# Patient Record
Sex: Female | Born: 1940 | Race: White | Hispanic: No | Smoking: Never smoker
Health system: Southern US, Community
[De-identification: ages and names within clinical notes are randomized; demographics above are authoritative.]

## PROBLEM LIST (undated history)

## (undated) DIAGNOSIS — E039 Hypothyroidism, unspecified: Secondary | ICD-10-CM

## (undated) DIAGNOSIS — I10 Essential (primary) hypertension: Secondary | ICD-10-CM

## (undated) DIAGNOSIS — M4316 Spondylolisthesis, lumbar region: Secondary | ICD-10-CM

## (undated) DIAGNOSIS — Z972 Presence of dental prosthetic device (complete) (partial): Secondary | ICD-10-CM

## (undated) HISTORY — PX: MULTIPLE TOOTH EXTRACTIONS: SHX2053

## (undated) HISTORY — PX: CATARACT EXTRACTION W/ INTRAOCULAR LENS IMPLANT: SHX1309

## (undated) HISTORY — PX: COLONOSCOPY W/ BIOPSIES AND POLYPECTOMY: SHX1376

## (undated) HISTORY — PX: TONSILLECTOMY: SUR1361

## (undated) HISTORY — PX: BACK SURGERY: SHX140

---

## 1999-05-05 ENCOUNTER — Encounter: Payer: Self-pay | Admitting: Family Medicine

## 1999-05-05 ENCOUNTER — Encounter: Admission: RE | Admit: 1999-05-05 | Discharge: 1999-05-05 | Payer: Self-pay | Admitting: Family Medicine

## 1999-05-08 ENCOUNTER — Other Ambulatory Visit: Admission: RE | Admit: 1999-05-08 | Discharge: 1999-05-08 | Payer: Self-pay | Admitting: Family Medicine

## 2015-10-13 ENCOUNTER — Other Ambulatory Visit: Payer: Self-pay | Admitting: Family Medicine

## 2015-10-13 DIAGNOSIS — M5430 Sciatica, unspecified side: Secondary | ICD-10-CM

## 2015-10-14 ENCOUNTER — Ambulatory Visit
Admission: RE | Admit: 2015-10-14 | Discharge: 2015-10-14 | Disposition: A | Discharge status: Home / Self Care | Payer: Medicare Other | Source: Ambulatory Visit | Attending: Family Medicine | Admitting: Family Medicine

## 2015-10-14 DIAGNOSIS — M4806 Spinal stenosis, lumbar region: Secondary | ICD-10-CM | POA: Diagnosis not present

## 2015-10-14 DIAGNOSIS — M5430 Sciatica, unspecified side: Secondary | ICD-10-CM | POA: Insufficient documentation

## 2015-10-14 DIAGNOSIS — Z9889 Other specified postprocedural states: Secondary | ICD-10-CM | POA: Diagnosis not present

## 2015-10-14 DIAGNOSIS — M5126 Other intervertebral disc displacement, lumbar region: Secondary | ICD-10-CM | POA: Diagnosis not present

## 2015-10-14 LAB — POCT I-STAT CREATININE: Creatinine, Ser: 0.8 mg/dL (ref 0.44–1.00)

## 2015-10-14 MED ORDER — GADOBENATE DIMEGLUMINE 529 MG/ML IV SOLN
20.0000 mL | Freq: Once | INTRAVENOUS | Status: AC | PRN
  Administered 2015-10-14: 16 mL via INTRAVENOUS

## 2015-10-29 ENCOUNTER — Ambulatory Visit: Payer: Self-pay

## 2015-12-26 ENCOUNTER — Other Ambulatory Visit: Payer: Self-pay | Admitting: Neurosurgery

## 2016-03-05 ENCOUNTER — Encounter (HOSPITAL_COMMUNITY)
Admission: RE | Admit: 2016-03-05 | Discharge: 2016-03-05 | Disposition: A | Discharge status: Home / Self Care | Payer: Medicare Other | Source: Ambulatory Visit | Attending: Neurosurgery | Admitting: Neurosurgery

## 2016-03-05 ENCOUNTER — Other Ambulatory Visit: Payer: Self-pay

## 2016-03-05 ENCOUNTER — Encounter (HOSPITAL_COMMUNITY): Payer: Self-pay

## 2016-03-05 DIAGNOSIS — I10 Essential (primary) hypertension: Secondary | ICD-10-CM | POA: Diagnosis not present

## 2016-03-05 DIAGNOSIS — Z01818 Encounter for other preprocedural examination: Secondary | ICD-10-CM | POA: Insufficient documentation

## 2016-03-05 DIAGNOSIS — E039 Hypothyroidism, unspecified: Secondary | ICD-10-CM | POA: Insufficient documentation

## 2016-03-05 DIAGNOSIS — M47896 Other spondylosis, lumbar region: Secondary | ICD-10-CM | POA: Insufficient documentation

## 2016-03-05 DIAGNOSIS — Z01810 Encounter for preprocedural cardiovascular examination: Secondary | ICD-10-CM | POA: Insufficient documentation

## 2016-03-05 HISTORY — DX: Hypothyroidism, unspecified: E03.9

## 2016-03-05 HISTORY — DX: Presence of dental prosthetic device (complete) (partial): Z97.2

## 2016-03-05 HISTORY — DX: Spondylolisthesis, lumbar region: M43.16

## 2016-03-05 HISTORY — DX: Essential (primary) hypertension: I10

## 2016-03-05 LAB — BASIC METABOLIC PANEL
Anion gap: 4 — ABNORMAL LOW (ref 5–15)
BUN: 21 mg/dL — AB (ref 6–20)
CALCIUM: 9.6 mg/dL (ref 8.9–10.3)
CHLORIDE: 107 mmol/L (ref 101–111)
CO2: 27 mmol/L (ref 22–32)
Creatinine, Ser: 0.92 mg/dL (ref 0.44–1.00)
GFR, EST NON AFRICAN AMERICAN: 59 mL/min — AB (ref >60)
Glucose, Bld: 92 mg/dL (ref 65–99)
Potassium: 4.3 mmol/L (ref 3.5–5.1)
Sodium: 138 mmol/L (ref 135–145)

## 2016-03-05 LAB — CBC WITH DIFFERENTIAL/PLATELET
BASOS ABS: 0 10*3/uL (ref 0.0–0.1)
BASOS PCT: 0 %
EOS ABS: 0.3 10*3/uL (ref 0.0–0.7)
EOS PCT: 4 %
HCT: 46.1 % — ABNORMAL HIGH (ref 36.0–46.0)
HEMOGLOBIN: 15.4 g/dL — AB (ref 12.0–15.0)
LYMPHS ABS: 2.7 10*3/uL (ref 0.7–4.0)
Lymphocytes Relative: 34 %
MCH: 30.8 pg (ref 26.0–34.0)
MCHC: 33.4 g/dL (ref 30.0–36.0)
MCV: 92.2 fL (ref 78.0–100.0)
Monocytes Absolute: 0.6 10*3/uL (ref 0.1–1.0)
Monocytes Relative: 7 %
NEUTROS PCT: 55 %
Neutro Abs: 4.4 10*3/uL (ref 1.7–7.7)
PLATELETS: 251 10*3/uL (ref 150–400)
RBC: 5 MIL/uL (ref 3.87–5.11)
RDW: 13.9 % (ref 11.5–15.5)
WBC: 8.1 10*3/uL (ref 4.0–10.5)

## 2016-03-05 LAB — TYPE AND SCREEN
ABO/RH(D): O POS
ANTIBODY SCREEN: NEGATIVE

## 2016-03-05 LAB — ABO/RH: ABO/RH(D): O POS

## 2016-03-05 LAB — SURGICAL PCR SCREEN
MRSA, PCR: NEGATIVE
Staphylococcus aureus: POSITIVE — AB

## 2016-03-05 NOTE — Pre-Procedure Instructions (Signed)
    Dolores Pattyeggy A Brester  03/05/2016      [facility] [address]- [place], [place] - [address] [place] [id number] Phone: [phone] Fax: [phone]    Your procedure is scheduled on Monday, March 15, 2016  [facility] at 6:00 A.M.  Call this number if you have problems the morning of surgery:  [phone]   Remember:  Do not eat food or drink liquids after midnight Sunday, March 14, 2016  Take these medicines the morning of surgery with A SIP OF WATER : levothyroxine (SYNTHROID), if needed:         TraMADol (ULTRAM) for pain Stop taking Aspirin, vitamins, fish oil and herbal medications. Do not take any NSAIDs ie: Ibuprofen, Advil, Naproxen, BC and Goody Powder or any medication containing Aspirin; stop Monday, March 08, 2016  Do not wear jewelry, make-up or nail polish.  Do not wear lotions, powders, or perfumes, or deoderant.  Do not shave 48 hours prior to surgery.    Do not bring valuables to the hospital.  [facility] is not responsible for any belongings or valuables.  Contacts, dentures or bridgework may not be worn into surgery.  Leave your suitcase in the car.  After surgery it may be brought to your room.  For patients admitted to the hospital, discharge time will be determined by your treatment team.  Patients discharged the day of surgery will not be allowed to drive home.   Name and phone number of your driver:   Special instructions: Shower the night before surgery and the morning of surgery with CHG.  Please read over the following fact sheets that you were given. Pain Booklet, Coughing and Deep Breathing, Blood Transfusion Information, MRSA Information and Surgical Site Infection Prevention

## 2016-03-05 NOTE — Progress Notes (Signed)
Pt denies SOB, chest pain, and being under the care of a cardiologist. Pt denies having a stress test, echo and cardiac cath. Pt denies having an EKG and chest x ray within the last year. Pt denies having any recent labs. Pt chart forwarded to anesthesia to review EKG. 

## 2016-03-08 NOTE — Progress Notes (Addendum)
Anesthesia Chart Review:  Pt is a 75 year old female scheduled for L4-5 PLIF on 03/15/2016 with Julio SicksHenry Pool, MD.   PMH includes:  HTN, hypothyroidism. Never smoker. BMI 29  Medications include: benazepril, levothyroxine  Preoperative labs reviewed.    EKG 03/05/16: NSR. Possible Inferior infarct, age undetermined. No old tracing for comparison  Rica Mastngela Kabbe, FNP-BC [facility]/Anesthesiology Phone: [phone] 03/08/2016 4:39 PM

## 2016-03-14 NOTE — Anesthesia Preprocedure Evaluation (Signed)
Anesthesia Evaluation  Patient identified by MRN, date of birth, ID band Patient awake    Reviewed: Allergy & Precautions, H&P , Patient's Chart, lab work & pertinent test results, reviewed documented beta blocker date and time   History of Anesthesia Complications (+) history of anesthetic complications  Airway Mallampati: II  TM Distance: >3 FB Neck ROM: full    Dental no notable dental hx.    Pulmonary    Pulmonary exam normal breath sounds clear to auscultation       Cardiovascular hypertension,  Rhythm:regular Rate:Normal     Neuro/Psych    GI/Hepatic   Endo/Other    Renal/GU      Musculoskeletal   Abdominal   Peds  Hematology   Anesthesia Other Findings   Reproductive/Obstetrics                             Anesthesia Physical Anesthesia Plan  ASA: II  Anesthesia Plan: General   Post-op Pain Management:    Induction: Intravenous  Airway Management Planned: Oral ETT  Additional Equipment:   Intra-op Plan:   Post-operative Plan: Extubation in OR  Informed Consent: I have reviewed the patients History and Physical, chart, labs and discussed the procedure including the risks, benefits and alternatives for the proposed anesthesia with the patient or authorized representative who has indicated his/her understanding and acceptance.   Dental Advisory Given and Dental advisory given  Plan Discussed with: CRNA and Surgeon  Anesthesia Plan Comments: (  Discussed general anesthesia, including possible nausea, instrumentation of airway, sore throat,pulmonary aspiration, etc. I asked if the were any outstanding questions, or  concerns before we proceeded. )        Anesthesia Quick Evaluation

## 2016-03-15 ENCOUNTER — Inpatient Hospital Stay (HOSPITAL_COMMUNITY)
Admission: RE | Admit: 2016-03-15 | Discharge: 2016-03-17 | DRG: 460 | Disposition: A | Discharge status: Home / Self Care | Payer: Medicare Other | Source: Ambulatory Visit | Attending: Neurosurgery | Admitting: Neurosurgery

## 2016-03-15 ENCOUNTER — Inpatient Hospital Stay (HOSPITAL_COMMUNITY): Payer: Medicare Other | Admitting: Certified Registered Nurse Anesthetist

## 2016-03-15 ENCOUNTER — Encounter (HOSPITAL_COMMUNITY)
Admission: RE | Disposition: A | Discharge status: Home / Self Care | Payer: Self-pay | Source: Ambulatory Visit | Attending: Neurosurgery

## 2016-03-15 ENCOUNTER — Inpatient Hospital Stay (HOSPITAL_COMMUNITY): Payer: Medicare Other | Admitting: Emergency Medicine

## 2016-03-15 ENCOUNTER — Encounter (HOSPITAL_COMMUNITY): Payer: Self-pay | Admitting: *Deleted

## 2016-03-15 ENCOUNTER — Inpatient Hospital Stay (HOSPITAL_COMMUNITY): Payer: Medicare Other

## 2016-03-15 DIAGNOSIS — Z9841 Cataract extraction status, right eye: Secondary | ICD-10-CM | POA: Diagnosis not present

## 2016-03-15 DIAGNOSIS — M5116 Intervertebral disc disorders with radiculopathy, lumbar region: Principal | ICD-10-CM | POA: Diagnosis present

## 2016-03-15 DIAGNOSIS — Z809 Family history of malignant neoplasm, unspecified: Secondary | ICD-10-CM

## 2016-03-15 DIAGNOSIS — M4316 Spondylolisthesis, lumbar region: Secondary | ICD-10-CM | POA: Diagnosis present

## 2016-03-15 DIAGNOSIS — Z961 Presence of intraocular lens: Secondary | ICD-10-CM | POA: Diagnosis present

## 2016-03-15 DIAGNOSIS — E039 Hypothyroidism, unspecified: Secondary | ICD-10-CM | POA: Diagnosis present

## 2016-03-15 DIAGNOSIS — M549 Dorsalgia, unspecified: Secondary | ICD-10-CM | POA: Diagnosis present

## 2016-03-15 DIAGNOSIS — M4310 Spondylolisthesis, site unspecified: Secondary | ICD-10-CM | POA: Diagnosis present

## 2016-03-15 DIAGNOSIS — I10 Essential (primary) hypertension: Secondary | ICD-10-CM | POA: Diagnosis present

## 2016-03-15 DIAGNOSIS — M48061 Spinal stenosis, lumbar region without neurogenic claudication: Secondary | ICD-10-CM | POA: Diagnosis present

## 2016-03-15 DIAGNOSIS — Z419 Encounter for procedure for purposes other than remedying health state, unspecified: Secondary | ICD-10-CM

## 2016-03-15 DIAGNOSIS — Z9842 Cataract extraction status, left eye: Secondary | ICD-10-CM

## 2016-03-15 DIAGNOSIS — R262 Difficulty in walking, not elsewhere classified: Secondary | ICD-10-CM

## 2016-03-15 SURGERY — POSTERIOR LUMBAR FUSION 1 LEVEL
Anesthesia: General | Site: Back

## 2016-03-15 MED ORDER — FENTANYL CITRATE (PF) 100 MCG/2ML IJ SOLN
INTRAMUSCULAR | Status: AC
  Filled 2016-03-15: qty 2

## 2016-03-15 MED ORDER — CHLORHEXIDINE GLUCONATE CLOTH 2 % EX PADS: 6.0000 | MEDICATED_PAD | Freq: Once | CUTANEOUS | Status: DC

## 2016-03-15 MED ORDER — PROPOFOL 10 MG/ML IV BOLUS
INTRAVENOUS | Status: DC | PRN
  Administered 2016-03-15: 100 mg via INTRAVENOUS

## 2016-03-15 MED ORDER — HYDROCODONE-ACETAMINOPHEN 5-325 MG PO TABS: 1.0000 | ORAL_TABLET | ORAL | Status: DC | PRN

## 2016-03-15 MED ORDER — ACETAMINOPHEN 160 MG/5ML PO SOLN: 650.0000 mg | Freq: Once | ORAL | Status: DC

## 2016-03-15 MED ORDER — PHENYLEPHRINE HCL 10 MG/ML IJ SOLN
INTRAVENOUS | Status: DC | PRN
  Administered 2016-03-15: 20 ug/min via INTRAVENOUS

## 2016-03-15 MED ORDER — CEFAZOLIN IN D5W 1 GM/50ML IV SOLN
1.0000 g | Freq: Three times a day (TID) | INTRAVENOUS | Status: AC
  Administered 2016-03-15 – 2016-03-16 (×2): 1 g via INTRAVENOUS
  Filled 2016-03-15 (×2): qty 50

## 2016-03-15 MED ORDER — FENTANYL CITRATE (PF) 100 MCG/2ML IJ SOLN
INTRAMUSCULAR | Status: DC | PRN
  Administered 2016-03-15: 50 ug via INTRAVENOUS
  Administered 2016-03-15: 25 ug via INTRAVENOUS
  Administered 2016-03-15: 50 ug via INTRAVENOUS
  Administered 2016-03-15: 100 ug via INTRAVENOUS
  Administered 2016-03-15: 50 ug via INTRAVENOUS
  Administered 2016-03-15: 25 ug via INTRAVENOUS

## 2016-03-15 MED ORDER — BUPIVACAINE HCL (PF) 0.25 % IJ SOLN
INTRAMUSCULAR | Status: DC | PRN
  Administered 2016-03-15: 20 mL

## 2016-03-15 MED ORDER — ACETAMINOPHEN 650 MG RE SUPP: 650.0000 mg | RECTAL | Status: DC | PRN

## 2016-03-15 MED ORDER — OXYCODONE-ACETAMINOPHEN 5-325 MG PO TABS
1.0000 | ORAL_TABLET | ORAL | Status: DC | PRN
  Administered 2016-03-15: 1 via ORAL
  Administered 2016-03-16 – 2016-03-17 (×9): 2 via ORAL
  Filled 2016-03-15 (×10): qty 2

## 2016-03-15 MED ORDER — ROCURONIUM BROMIDE 100 MG/10ML IV SOLN
INTRAVENOUS | Status: DC | PRN
  Administered 2016-03-15: 60 mg via INTRAVENOUS
  Administered 2016-03-15 (×2): 10 mg via INTRAVENOUS

## 2016-03-15 MED ORDER — CEFAZOLIN SODIUM-DEXTROSE 2-4 GM/100ML-% IV SOLN
2.0000 g | INTRAVENOUS | Status: AC
  Administered 2016-03-15: 2 g via INTRAVENOUS
  Filled 2016-03-15: qty 100

## 2016-03-15 MED ORDER — PHENYLEPHRINE HCL 10 MG/ML IJ SOLN
INTRAMUSCULAR | Status: DC | PRN
  Administered 2016-03-15: 80 ug via INTRAVENOUS
  Administered 2016-03-15: 40 ug via INTRAVENOUS

## 2016-03-15 MED ORDER — DIAZEPAM 5 MG PO TABS
ORAL_TABLET | ORAL | Status: AC
  Filled 2016-03-15: qty 1

## 2016-03-15 MED ORDER — 0.9 % SODIUM CHLORIDE (POUR BTL) OPTIME
TOPICAL | Status: DC | PRN
  Administered 2016-03-15: 1000 mL

## 2016-03-15 MED ORDER — PROPOFOL 10 MG/ML IV BOLUS
INTRAVENOUS | Status: AC
  Filled 2016-03-15: qty 20

## 2016-03-15 MED ORDER — VANCOMYCIN HCL 1000 MG IV SOLR
INTRAVENOUS | Status: AC
  Filled 2016-03-15: qty 1000

## 2016-03-15 MED ORDER — SODIUM CHLORIDE 0.9 % IR SOLN
Status: DC | PRN
  Administered 2016-03-15: 08:00:00

## 2016-03-15 MED ORDER — PHENOL 1.4 % MT LIQD: 1.0000 | OROMUCOSAL | Status: DC | PRN

## 2016-03-15 MED ORDER — ACETAMINOPHEN 325 MG PO TABS
ORAL_TABLET | ORAL | Status: AC
  Filled 2016-03-15: qty 2

## 2016-03-15 MED ORDER — VANCOMYCIN HCL 1000 MG IV SOLR
INTRAVENOUS | Status: DC | PRN
Start: 2016-03-15 — End: 2016-03-15
  Administered 2016-03-15: 1000 mg

## 2016-03-15 MED ORDER — SODIUM CHLORIDE 0.9% FLUSH: 3.0000 mL | Freq: Two times a day (BID) | INTRAVENOUS | Status: DC

## 2016-03-15 MED ORDER — ONDANSETRON HCL 4 MG/2ML IJ SOLN: 4.0000 mg | INTRAMUSCULAR | Status: DC | PRN

## 2016-03-15 MED ORDER — DIAZEPAM 5 MG PO TABS
5.0000 mg | ORAL_TABLET | Freq: Four times a day (QID) | ORAL | Status: DC | PRN
  Administered 2016-03-15 – 2016-03-17 (×7): 5 mg via ORAL
  Filled 2016-03-15 (×2): qty 1
  Filled 2016-03-15: qty 2
  Filled 2016-03-15 (×4): qty 1

## 2016-03-15 MED ORDER — FENTANYL CITRATE (PF) 100 MCG/2ML IJ SOLN
INTRAMUSCULAR | Status: AC
  Administered 2016-03-15: 25 ug via INTRAVENOUS
  Filled 2016-03-15: qty 2

## 2016-03-15 MED ORDER — HYDROMORPHONE HCL 1 MG/ML IJ SOLN
0.5000 mg | INTRAMUSCULAR | Status: DC | PRN
  Administered 2016-03-15: 1 mg via INTRAVENOUS
  Filled 2016-03-15: qty 1

## 2016-03-15 MED ORDER — LACTATED RINGERS IV SOLN
INTRAVENOUS | Status: DC
  Administered 2016-03-15 (×3): via INTRAVENOUS

## 2016-03-15 MED ORDER — EPHEDRINE SULFATE 50 MG/ML IJ SOLN
INTRAMUSCULAR | Status: DC | PRN
  Administered 2016-03-15: 10 mg via INTRAVENOUS
  Administered 2016-03-15: 15 mg via INTRAVENOUS

## 2016-03-15 MED ORDER — ACETAMINOPHEN 325 MG PO TABS: 650.0000 mg | ORAL_TABLET | ORAL | Status: DC | PRN

## 2016-03-15 MED ORDER — BENAZEPRIL HCL 40 MG PO TABS
40.0000 mg | ORAL_TABLET | Freq: Every day | ORAL | Status: DC
  Filled 2016-03-15 (×3): qty 1

## 2016-03-15 MED ORDER — SODIUM CHLORIDE 0.9 % IV SOLN
INTRAVENOUS | Status: DC | PRN
  Administered 2016-03-15: 11:00:00 via INTRAVENOUS

## 2016-03-15 MED ORDER — LEVOTHYROXINE SODIUM 100 MCG PO TABS
150.0000 ug | ORAL_TABLET | Freq: Every day | ORAL | Status: DC
  Administered 2016-03-16 – 2016-03-17 (×2): 150 ug via ORAL
  Filled 2016-03-15 (×2): qty 2

## 2016-03-15 MED ORDER — FENTANYL CITRATE (PF) 100 MCG/2ML IJ SOLN
25.0000 ug | INTRAMUSCULAR | Status: DC | PRN
  Administered 2016-03-15: 25 ug via INTRAVENOUS
  Administered 2016-03-15: 50 ug via INTRAVENOUS
  Administered 2016-03-15: 25 ug via INTRAVENOUS

## 2016-03-15 MED ORDER — SUGAMMADEX SODIUM 200 MG/2ML IV SOLN
INTRAVENOUS | Status: DC | PRN
Start: 2016-03-15 — End: 2016-03-15
  Administered 2016-03-15: 200 mg via INTRAVENOUS

## 2016-03-15 MED ORDER — DEXAMETHASONE SODIUM PHOSPHATE 10 MG/ML IJ SOLN
10.0000 mg | INTRAMUSCULAR | Status: DC
  Filled 2016-03-15: qty 1

## 2016-03-15 MED ORDER — SODIUM CHLORIDE 0.9% FLUSH: 3.0000 mL | INTRAVENOUS | Status: DC | PRN

## 2016-03-15 MED ORDER — DEXAMETHASONE SODIUM PHOSPHATE 4 MG/ML IJ SOLN
INTRAMUSCULAR | Status: DC | PRN
  Administered 2016-03-15: 10 mg via INTRAVENOUS

## 2016-03-15 MED ORDER — ACETAMINOPHEN 325 MG PO TABS
650.0000 mg | ORAL_TABLET | Freq: Four times a day (QID) | ORAL | Status: DC | PRN
  Administered 2016-03-15: 650 mg via ORAL

## 2016-03-15 MED ORDER — FENTANYL CITRATE (PF) 100 MCG/2ML IJ SOLN
INTRAMUSCULAR | Status: AC
  Filled 2016-03-15: qty 4

## 2016-03-15 MED ORDER — MENTHOL 3 MG MT LOZG: 1.0000 | LOZENGE | OROMUCOSAL | Status: DC | PRN

## 2016-03-15 MED ORDER — FENTANYL CITRATE (PF) 100 MCG/2ML IJ SOLN
50.0000 ug | INTRAMUSCULAR | Status: DC | PRN
  Administered 2016-03-15 (×2): 50 ug via INTRAVENOUS

## 2016-03-15 MED ORDER — TRAMADOL HCL 50 MG PO TABS: 50.0000 mg | ORAL_TABLET | Freq: Four times a day (QID) | ORAL | Status: DC | PRN

## 2016-03-15 MED ORDER — THROMBIN 20000 UNITS EX SOLR
CUTANEOUS | Status: DC | PRN
  Administered 2016-03-15: 08:00:00 via TOPICAL

## 2016-03-15 MED ORDER — THROMBIN 20000 UNITS EX SOLR
CUTANEOUS | Status: AC
  Filled 2016-03-15: qty 20000

## 2016-03-15 MED ORDER — ARTIFICIAL TEARS OP OINT
TOPICAL_OINTMENT | OPHTHALMIC | Status: DC | PRN
  Administered 2016-03-15: 1 via OPHTHALMIC

## 2016-03-15 MED ORDER — LIDOCAINE HCL (CARDIAC) 20 MG/ML IV SOLN
INTRAVENOUS | Status: DC | PRN
  Administered 2016-03-15: 100 mg via INTRAVENOUS

## 2016-03-15 MED ORDER — ONDANSETRON HCL 4 MG/2ML IJ SOLN
INTRAMUSCULAR | Status: DC | PRN
  Administered 2016-03-15: 4 mg via INTRAVENOUS

## 2016-03-15 SURGICAL SUPPLY — 64 items
ADH SKN CLS APL DERMABOND .7 (GAUZE/BANDAGES/DRESSINGS) ×1
APL SKNCLS STERI-STRIP NONHPOA (GAUZE/BANDAGES/DRESSINGS) ×1
BAG DECANTER FOR FLEXI CONT (MISCELLANEOUS) ×3 IMPLANT
BENZOIN TINCTURE PRP APPL 2/3 (GAUZE/BANDAGES/DRESSINGS) ×3 IMPLANT
BLADE CLIPPER SURG (BLADE) IMPLANT
BUR CUTTER 7.0 ROUND (BURR) IMPLANT
BUR MATCHSTICK NEURO 3.0 LAGG (BURR) ×3 IMPLANT
CANISTER SUCT 3000ML PPV (MISCELLANEOUS) ×3 IMPLANT
CAP LCK SPNE (Orthopedic Implant) ×6 IMPLANT
CAP LOCK SPINE RADIUS (Orthopedic Implant) ×6 IMPLANT
CAP LOCKING (Orthopedic Implant) ×12 IMPLANT
CLOSURE WOUND 1/2 X4 (GAUZE/BANDAGES/DRESSINGS) ×2
CONT SPEC 4OZ CLIKSEAL STRL BL (MISCELLANEOUS) ×3 IMPLANT
COVER BACK TABLE 60X90IN (DRAPES) ×3 IMPLANT
CROSSLINK MEDIUM (Orthopedic Implant) ×3 IMPLANT
DECANTER SPIKE VIAL GLASS SM (MISCELLANEOUS) IMPLANT
DERMABOND ADVANCED (GAUZE/BANDAGES/DRESSINGS) ×2
DERMABOND ADVANCED .7 DNX12 (GAUZE/BANDAGES/DRESSINGS) ×1 IMPLANT
DEVICE INTERBODY ELEVATE 23X7 (Cage) ×6 IMPLANT
DEVICE INTERBODY ELEVATE 23X8 (Cage) ×6 IMPLANT
DRAPE C-ARM 42X72 X-RAY (DRAPES) ×6 IMPLANT
DRAPE HALF SHEET 40X57 (DRAPES) IMPLANT
DRAPE LAPAROTOMY 100X72X124 (DRAPES) ×3 IMPLANT
DRAPE POUCH INSTRU U-SHP 10X18 (DRAPES) ×3 IMPLANT
DRAPE SURG 17X23 STRL (DRAPES) ×12 IMPLANT
DRSG OPSITE POSTOP 4X8 (GAUZE/BANDAGES/DRESSINGS) ×3 IMPLANT
DURAPREP 26ML APPLICATOR (WOUND CARE) ×3 IMPLANT
ELECT REM PT RETURN 9FT ADLT (ELECTROSURGICAL) ×3
ELECTRODE REM PT RTRN 9FT ADLT (ELECTROSURGICAL) ×1 IMPLANT
EVACUATOR 1/8 PVC DRAIN (DRAIN) IMPLANT
GAUZE SPONGE 4X4 12PLY STRL (GAUZE/BANDAGES/DRESSINGS) ×3 IMPLANT
GAUZE SPONGE 4X4 16PLY XRAY LF (GAUZE/BANDAGES/DRESSINGS) IMPLANT
GLOVE ECLIPSE 6.5 STRL STRAW (GLOVE) ×3 IMPLANT
GLOVE ECLIPSE 9.0 STRL (GLOVE) ×9 IMPLANT
GLOVE EXAM NITRILE LRG STRL (GLOVE) IMPLANT
GLOVE EXAM NITRILE XL STR (GLOVE) IMPLANT
GLOVE EXAM NITRILE XS STR PU (GLOVE) IMPLANT
GOWN STRL REUS W/ TWL LRG LVL3 (GOWN DISPOSABLE) ×2 IMPLANT
GOWN STRL REUS W/ TWL XL LVL3 (GOWN DISPOSABLE) ×2 IMPLANT
GOWN STRL REUS W/TWL 2XL LVL3 (GOWN DISPOSABLE) IMPLANT
GOWN STRL REUS W/TWL LRG LVL3 (GOWN DISPOSABLE) ×6
GOWN STRL REUS W/TWL XL LVL3 (GOWN DISPOSABLE) ×6
KIT BASIN OR (CUSTOM PROCEDURE TRAY) ×3 IMPLANT
KIT ROOM TURNOVER OR (KITS) ×3 IMPLANT
NEEDLE HYPO 22GX1.5 SAFETY (NEEDLE) ×3 IMPLANT
NS IRRIG 1000ML POUR BTL (IV SOLUTION) ×3 IMPLANT
PACK LAMINECTOMY NEURO (CUSTOM PROCEDURE TRAY) ×3 IMPLANT
ROD 5.5X60MM GREEN (Rod) ×3 IMPLANT
ROD 70MM (Rod) ×2 IMPLANT
ROD SPNL 70X5.5XNS TI RDS (Rod) ×1 IMPLANT
SCREW 5.75X40M (Screw) ×6 IMPLANT
SCREW 5.75X45MM (Screw) ×12 IMPLANT
SPACER SPNL STD 23X8XSTRL (Cage) ×2 IMPLANT
SPCR SPNL STD 23X8XSTRL (Cage) ×2 IMPLANT
SPONGE SURGIFOAM ABS GEL 100 (HEMOSTASIS) ×6 IMPLANT
STRIP CLOSURE SKIN 1/2X4 (GAUZE/BANDAGES/DRESSINGS) ×4 IMPLANT
SUT VIC AB 0 CT1 18XCR BRD8 (SUTURE) ×2 IMPLANT
SUT VIC AB 0 CT1 8-18 (SUTURE) ×4
SUT VIC AB 2-0 CT1 18 (SUTURE) ×3 IMPLANT
SUT VIC AB 3-0 SH 8-18 (SUTURE) ×6 IMPLANT
TOWEL OR 17X24 6PK STRL BLUE (TOWEL DISPOSABLE) ×3 IMPLANT
TOWEL OR 17X26 10 PK STRL BLUE (TOWEL DISPOSABLE) ×3 IMPLANT
TRAY FOLEY W/METER SILVER 16FR (SET/KITS/TRAYS/PACK) ×3 IMPLANT
WATER STERILE IRR 1000ML POUR (IV SOLUTION) ×3 IMPLANT

## 2016-03-15 NOTE — Anesthesia Postprocedure Evaluation (Signed)
Anesthesia Post Note  Patient: Heather Cantrell  Procedure(s) Performed: Procedure(s) (LRB): Posterior Lumbar interbody -Fusion Lumbar four-five and lumbar three-four (N/A)  Patient location during evaluation: PACU Anesthesia Type: General Level of consciousness: sedated Pain management: satisfactory to patient Vital Signs Assessment: post-procedure vital signs reviewed and stable Respiratory status: spontaneous breathing Cardiovascular status: stable Anesthetic complications: no    Last Vitals:  Vitals:   03/15/16 1535 03/15/16 1616  BP: (!) 153/76 (!) 162/67  Pulse: 66 69  Resp: 14 16  Temp: 36.7 C 36.4 C    Last Pain:  Vitals:   03/15/16 1535  TempSrc:   PainSc: 3                  Sharetha Newson EDWARD

## 2016-03-15 NOTE — Anesthesia Procedure Notes (Signed)
Procedure Name: Intubation Date/Time: 03/15/2016 9:04 AM Performed by: Tillman AbideHAWKINS, Daesia Zylka B Pre-anesthesia Checklist: Patient identified, Emergency Drugs available, Suction available and Patient being monitored Patient Re-evaluated:Patient Re-evaluated prior to inductionOxygen Delivery Method: Circle System Utilized Preoxygenation: Pre-oxygenation with 100% oxygen Intubation Type: IV induction Ventilation: Mask ventilation without difficulty Laryngoscope Size: Miller Grade View: Grade I Tube type: Oral Tube size: 7.5 mm Number of attempts: 1 Airway Equipment and Method: Stylet Placement Confirmation: ETT inserted through vocal cords under direct vision,  positive ETCO2 and breath sounds checked- equal and bilateral Secured at: 22 cm Tube secured with: Tape Dental Injury: Teeth and Oropharynx as per pre-operative assessment  Difficulty Due To: Difficulty was unanticipated

## 2016-03-15 NOTE — Op Note (Signed)
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  Postoperative diagnosis: Same  Procedure Name: L3-4, L4-5 reexploration of laminotomy with bilateral L3, L4, L5 foraminotomies (more than would be required for simple interbody fusion alone)  L3-4, L4-5 posterior lumbar interbody fusion utilizing interbody expandable cages and locally harvested autograft.  L3-4-5 posterior lateral arthrodesis utilizing segmental pedicle screw fixation and local autograft.  Surgeon:Kaan Tosh A.Weslyn Holsonback, M.D.  Asst. Surgeon: Franky Machoabbell  Anesthesia: General  Indication: 75 year old female status post multilevel right-sided decompressive hemilaminectomies in the past who presents now with worsening back and right lower extremity pain. Workup demonstrates evidence of progressive degenerative/post laminectomy spondylolisthesis at L4-5 with an associated disc herniation and marked foraminal stenosis causing severe compression of the right L4 nerve root and moderate compression of the right L5 nerve root. Patient also with significant disc degeneration at L3-4.  Operative note: After induction of anesthesia, patient positioned prone on the Wilson frame and appropriate padded. Lumbar region prepped and draped sterilely. Incision made overlying L3-4-5. Dissection performed bilaterally. Retractor placed. FluoroScan use. Level was confirmed. Unrecognized fracture of her inferior facet of L3 on the right was discovered.. Exploring this interspace determined there was gross instability at this level in addition to the spondylolisthesis at L4-5, I made the decision to proceed with L3-4 and L4-5 fusion based on this fracture and instability at L3-4  . I I dissected free the previous hemilaminotomies at L3-4  and L4-5 on the right side. I completed complete laminectomies of L L3-L4 and the superior aspect of L5 bilaterally. I resected ligamentum flavum and epidural scar. Bilateral decompressive foraminotomies were performed on course exiting L3-L4 and L5 nerve roots. Superior disc herniation is an associated osteophyte was removed at L4-5. Bilateral discectomy was then performed at L3-4 and L4-5. Disc spaces were then prepared for interbody fusion. Disc spaces were cleaned with various curettes and reamers. With distractors left patient's right side. An 8 mm standard Medtronic expandable cage was impacted into place at L3-4 and a 7 mm lordotic cage was impacted in place at L4-5. Each cage was expanded to its full extent. Distractors removed and patient's contralateral side. Disc space was prepared on the contralateral side bone graft was packed into the interspace and a second cage was then impacted in place and expanded to its full extent. Pedicle screws are placed bilaterally at L3-4 and 5 bilaterally utilizing fluoroscopic guidance. 5.75 mm full-radius Grant screws were placed bilaterally. Posterior lateral bone was packed using local autograft. Short segment titanium rods placed of the screw heads. Locking caps with her the screws. Locking caps engaged under mild compression. Gelfoam was placed topically every thecal sac. Baseline question Jarold Mottoatterson place a deep point 2 medium Hemovac drains left deep 1 . Wounds and closed in layers with Vicryl sutures. Steri-Strips triggers were applied. No apparent complications. Patient tolerated the procedure well and she returns to the recover room postop.

## 2016-03-15 NOTE — Transfer of Care (Signed)
Immediate Anesthesia Transfer of Care Note  Patient: Heather Cantrell  Procedure(s) Performed: Procedure(s): Posterior Lumbar interbody -Fusion Lumbar four-five and lumbar three-four (N/A)  Patient Location: PACU  Anesthesia Type:General  Level of Consciousness: awake, alert , oriented and patient cooperative  Airway & Oxygen Therapy: Patient Spontanous Breathing and Patient connected to nasal cannula oxygen  Post-op Assessment: Report given to RN and Post -op Vital signs reviewed and stable  Post vital signs: Reviewed and stable  Last Vitals:  Vitals:   03/15/16 0740  BP: 140/84  Pulse: 67  Resp: 20  Temp: 36.7 C    Last Pain:  Vitals:   03/15/16 0740  TempSrc: Oral  PainSc: 3       Patients Stated Pain Goal: 4 (03/15/16 0740)  Complications: No apparent anesthesia complications

## 2016-03-15 NOTE — H&P (Signed)
  Heather Cantrell is an 75 y.o. female.   Chief Complaint: Back and right leg pain HPI: 75 year old female with severe back and bilateral lower extremity pain right greater than left. Workup demonstrates evidence of a degenerative spondylolisthesis with recurrent lumbar disc herniation at L4-5 causing significant stenosis. Patient is failed conservative management and presents now for decompression and fusion at L4-5 in hopes of improving her symptoms.  Past Medical History:  Diagnosis Date  . Hypertension   . Hypothyroidism   . Spondylolisthesis at L4-L5 level   . Wears dentures    full set    Past Surgical History:  Procedure Laterality Date  . BACK SURGERY    . CATARACT EXTRACTION W/ INTRAOCULAR LENS IMPLANT     B/L  . COLONOSCOPY W/ BIOPSIES AND POLYPECTOMY    . MULTIPLE TOOTH EXTRACTIONS    . TONSILLECTOMY      Family History  Problem Relation Age of Onset  . Cancer Mother   . Cancer Father    Social History:  reports that she has never smoked. She has never used smokeless tobacco. She reports that she drinks alcohol. She reports that she does not use drugs.  Allergies:  Allergies  Allergen Reactions  . No Allergies On File     Medications Prior to Admission  Medication Sig Dispense Refill  . benazepril (LOTENSIN) 40 MG tablet Take 40 mg by mouth daily.    Marland Kitchen. levothyroxine (SYNTHROID, LEVOTHROID) 150 MCG tablet Take 150 mcg by mouth daily before breakfast.    . traMADol (ULTRAM) 50 MG tablet Take 50 mg by mouth every 6 (six) hours as needed for moderate pain.      No results found for this or any previous visit (from the past 48 hour(s)). No results found.  Pertinent items noted in HPI and remainder of comprehensive ROS otherwise negative.  Blood pressure 140/84, pulse 67, temperature 98 F (36.7 C), temperature source Oral, resp. rate 20, height 5\' 4"  (1.626 m), weight 77.1 kg (170 lb), SpO2 100 %.  Patient is awake and alert. She is oriented and appropriate.  Cranial nerve function is intact. Speech is fluent. Judgment and insight are intact. Neck is supple. Examination her extremities reveals no evidence of injury or deformity. Motor strength reveals 5 over 5 strength in both upper and lower extremities. Sensory examination was decreased sensation. Light touch in her right L4 and L5 dermatomes. Wound is well-healed. Chest and abdomen benign. Assessment/Plan L4-5 degenerative/post laminectomy spondylolisthesis with stenosis and radiculopathy. Plan reexploration of L4-5 laminotomy with bilateral L4-5 decompressive laminotomies and foraminotomies followed by posterior lumbar interbody fusion utilizing interbody expandable cages, locally harvested autograft, and augmented with posterior lateral arthrodesis utilizing nonsegmental pedicle screw fixation and local autograft. Risks and benefits been explained. Patient wishes to proceed.  Draxton Luu A 03/15/2016, 8:51 AM

## 2016-03-16 LAB — CBC
HCT: 33.8 % — ABNORMAL LOW (ref 36.0–46.0)
HEMOGLOBIN: 11 g/dL — AB (ref 12.0–15.0)
MCH: 29.6 pg (ref 26.0–34.0)
MCHC: 32.5 g/dL (ref 30.0–36.0)
MCV: 91.1 fL (ref 78.0–100.0)
PLATELETS: 249 10*3/uL (ref 150–400)
RBC: 3.71 MIL/uL — ABNORMAL LOW (ref 3.87–5.11)
RDW: 14.1 % (ref 11.5–15.5)
WBC: 10.9 10*3/uL — ABNORMAL HIGH (ref 4.0–10.5)

## 2016-03-16 MED FILL — Sodium Chloride IV Soln 0.9%: INTRAVENOUS | Qty: 2000 | Status: AC

## 2016-03-16 MED FILL — Heparin Sodium (Porcine) Inj 1000 Unit/ML: INTRAMUSCULAR | Qty: 30 | Status: AC

## 2016-03-16 NOTE — Progress Notes (Signed)
Postop day 1. Afebrile. Vitals are stable. Awake and alert. Oriented and appropriate. Motor and sensory function intact. Preoperative pain much improved. Ambulating well. Possible discharge tomorrow.

## 2016-03-16 NOTE — Evaluation (Signed)
Occupational Therapy Evaluation Patient Details Name: Heather Cantrell MRN: [id number] DOB: 1940-11-20 Today's Date: 03/16/2016    History of Present Illness Pt is a 75 y/o female s/p L3-5 foraminotomies and PLIF. PMHx: hypertension, hypothyrodism, back sx   Clinical Impression   PTA Pt independent in ADL and IADL, lives alone with recent move to [place] from [place], [place]. OT reviewed back precautions with Pt and daughter, educated on AE. Pt with deficits below. Pt will continue to benefit from skilled OT intervention while in the acute setting to practice AE use and maintaining precautions during ADL.      Follow Up Recommendations  No OT follow up;Supervision - Intermittent    Equipment Recommendations  3 in 1 bedside comode    Recommendations for Other Services       Precautions / Restrictions Precautions Precautions: Back Precaution Booklet Issued: Yes (comment) Precaution Comments: BAT and no lifting precautions reviewed Required Braces or Orthoses: Spinal Brace Spinal Brace: Lumbar corset;Applied in sitting position;Other (comment) (already applied when OT entered room)      Mobility Bed Mobility               General bed mobility comments: Pt in recliner when OT entered the room.  Transfers Overall transfer level: Needs assistance Equipment used: None Transfers: Sit to/from Stand Sit to Stand: Supervision         General transfer comment: cue to sit edge of chair to make maintaining no bending easier.    Balance                                            ADL Overall ADL's : Needs assistance/impaired Eating/Feeding: Modified independent;Sitting   Grooming: Wash/dry hands;Standing Grooming Details (indicate cue type and reason): pt educated in cup strategy for oral care, putting items on dominant side to prevent twisting Upper Body Bathing: Supervision/ safety;Sitting Upper Body Bathing Details (indicate cue type and reason):  Pt edcuated in arching during hair care.  Lower Body Bathing: Supervison/ safety;Sit to/from stand Lower Body Bathing Details (indicate cue type and reason): Pt educated on long handle sponge Upper Body Dressing : Cueing for sequencing;Minimal assistance;Sitting Upper Body Dressing Details (indicate cue type and reason): Pt and daughter educated on don/doff brace, and sequencing for dressing Lower Body Dressing: Minimal assistance;With caregiver independent assisting;With adaptive equipment;Sit to/from stand Lower Body Dressing Details (indicate cue type and reason): Pt educated on grabber for LB dressing and practiced. Toilet Transfer: Supervision/safety;Ambulation;BSC;Comfort height toilet Toilet Transfer Details (indicate cue type and reason): Pt does not have support near toilet for power up, heavy use of 3in1 over toilet for power up. Toileting- Water quality scientist and Hygiene: Min guard   Tub/ Shower Transfer: Walk-in shower;Min guard;Ambulation   Functional mobility during ADLs: Min guard General ADL Comments: Daughter was present for entire session, and received education as well as Pt. Pt can reach both feet, but really needed grabber for LB dressing. Pt educated on multiple uses of 3 in1     Vision     Perception     Praxis      Pertinent Vitals/Pain Pain Assessment: 0-10 Pain Score: 7  Pain Location: back Pain Descriptors / Indicators: Sore;Aching Pain Intervention(s): Limited activity within patient's tolerance;Monitored during session;Repositioned     Hand Dominance Right   Extremity/Trunk Assessment Upper Extremity Assessment Upper Extremity Assessment: Overall WFL for tasks assessed  Lower Extremity Assessment Lower Extremity Assessment: Overall WFL for tasks assessed   Cervical / Trunk Assessment Cervical / Trunk Assessment: Normal   Communication Communication Communication: No difficulties   Cognition Arousal/Alertness: Awake/alert Behavior During  Therapy: WFL for tasks assessed/performed Overall Cognitive Status: Within Functional Limits for tasks assessed                     General Comments       Exercises       Shoulder Instructions      Home Living Family/patient expects to be discharged to:: Private residence Living Arrangements: Alone Available Help at Discharge: Family;Available 24 hours/day Type of Home: House Home Access: Level entry     Home Layout: One level     Bathroom Shower/Tub: Occupational psychologist: Handicapped height     Home Equipment: None          Prior Functioning/Environment Level of Independence: Independent                 OT Problem List: Decreased range of motion;Decreased knowledge of use of DME or AE;Decreased knowledge of precautions;Pain   OT Treatment/Interventions:      OT Goals(Current goals can be found in the care plan section) Acute Rehab OT Goals Patient Stated Goal: get back to adventures and see more of [place] OT Goal Formulation: With patient/family Time For Goal Achievement: 03/23/16 Potential to Achieve Goals: Good ADL Goals Pt Will Perform Grooming: with set-up;standing Pt Will Perform Lower Body Dressing: with supervision;with caregiver independent in assisting;with adaptive equipment;sit to/from stand Additional ADL Goal #1: Pt will recall 4/4 back precautions with no more than 1 verbal cue  OT Frequency:     Barriers to D/C:            Co-evaluation              End of Session Equipment Utilized During Treatment: Other (comment) (Pt educated on AE kit.) Nurse Communication: Mobility status  Activity Tolerance: Patient tolerated treatment well Patient left: in chair;with call bell/phone within reach;with family/visitor present   Time: 3007-6226 OT Time Calculation (min): 34 min Charges:  OT General Charges $OT Visit: 1 Procedure OT Evaluation $OT Eval Low Complexity: 1 Procedure OT Treatments $Self Care/Home  Management : 8-22 mins G-Codes:    Merri Ray Michiah Masse 04-04-2016, 1:41 PM  Hulda Humphrey OTR/L [phone]

## 2016-03-16 NOTE — Evaluation (Signed)
Physical Therapy Evaluation Patient Details Name: Heather Cantrell MRN: [id number] DOB: [birth year] Today's Date: 03/16/2016   History of Present Illness  Pt is a 75 y/o female s/p L3-5 foraminotomies and PLIF. PMHx: hypertension, hypothyrodism, back sx  Clinical Impression  Pt very pleasant and moving well. Pt educated for back precautions, mobility, transfers, brace wear, gait and function with daughter present. Pt with decreased mobility and pain who will benefit from acute therapy to maximize mobility and function adhering to precautions to decrease burden of care.     Follow Up Recommendations No PT follow up    Equipment Recommendations  None recommended by PT    Recommendations for Other Services       Precautions / Restrictions Precautions Precautions: Back Precaution Booklet Issued: Yes (comment) Required Braces or Orthoses: Spinal Brace Spinal Brace: Lumbar corset;Applied in sitting position      Mobility  Bed Mobility Overal bed mobility: Needs Assistance Bed Mobility: Rolling;Sidelying to Sit Rolling: Supervision Sidelying to sit: Supervision       General bed mobility comments: cues for sequence and precautions  Transfers Overall transfer level: Needs assistance   Transfers: Sit to/from Stand Sit to Stand: Supervision         General transfer comment: cues for posture  Ambulation/Gait Ambulation/Gait assistance: Supervision Ambulation Distance (Feet): 400 Feet Assistive device: None Gait Pattern/deviations: WFL(Within Functional Limits)   Gait velocity interpretation: Below normal speed for age/gender    Stairs            Wheelchair Mobility    Modified Rankin (Stroke Patients Only)       Balance                                             Pertinent Vitals/Pain Pain Assessment: 0-10 Pain Score: 7  Pain Location: back Pain Descriptors / Indicators: Aching;Sore Pain Intervention(s): Limited activity within  patient's tolerance;Monitored during session;Premedicated before session;Repositioned    Home Living Family/patient expects to be discharged to:: Private residence Living Arrangements: Alone Available Help at Discharge: Family;Available 24 hours/day Type of Home: House Home Access: Level entry     Home Layout: One level Home Equipment: None      Prior Function Level of Independence: Independent               Hand Dominance        Extremity/Trunk Assessment   Upper Extremity Assessment: Overall WFL for tasks assessed           Lower Extremity Assessment: Overall WFL for tasks assessed      Cervical / Trunk Assessment: Normal  Communication   Communication: No difficulties  Cognition Arousal/Alertness: Awake/alert Behavior During Therapy: WFL for tasks assessed/performed Overall Cognitive Status: Within Functional Limits for tasks assessed                      General Comments      Exercises     Assessment/Plan    PT Assessment Patient needs continued PT services  PT Problem List Decreased activity tolerance;Pain;Decreased knowledge of precautions          PT Treatment Interventions Gait training;Therapeutic activities;Patient/family education;Functional mobility training    PT Goals (Current goals can be found in the Care Plan section)  Acute Rehab PT Goals Patient Stated Goal: return to gambling, seeing the grandkids PT Goal Formulation: With patient/family  Time For Goal Achievement: 03/23/16 Potential to Achieve Goals: Good    Frequency Min 5X/week   Barriers to discharge   family to stay with pt for at least a week    Co-evaluation               End of Session Equipment Utilized During Treatment: Back brace Activity Tolerance: Patient tolerated treatment well Patient left: in chair;with call bell/phone within reach;with family/visitor present Nurse Communication: Mobility status;Precautions         Time:  [id number] PT Time Calculation (min) (ACUTE ONLY): 20 min   Charges:   PT Evaluation $PT Eval Moderate Complexity: 1 Procedure     PT G CodesDelorse Lek:        Tabor, Beth Spackman Beth 03/16/2016, 9:08 AM Delaney MeigsMaija Tabor Micheil Klaus, PT [phone]

## 2016-03-17 MED ORDER — OXYCODONE-ACETAMINOPHEN 5-325 MG PO TABS: 1.0000 | ORAL_TABLET | ORAL | 0 refills | Status: DC | PRN

## 2016-03-17 MED ORDER — DIAZEPAM 5 MG PO TABS: 5.0000 mg | ORAL_TABLET | Freq: Four times a day (QID) | ORAL | 0 refills | Status: DC | PRN

## 2016-03-17 NOTE — Discharge Instructions (Signed)

## 2016-03-17 NOTE — Progress Notes (Signed)
Patient alert and oriented, mae's well, voiding adequate amount of urine, swallowing without difficulty, no c/o pain. Patient discharged home with family. Script and discharged instructions given to patient. Patient and family stated understanding of d/c instructions given and has an appointment with MD. 

## 2016-03-17 NOTE — Progress Notes (Signed)
Occupational Therapy Treatment Patient Details Name: Heather Cantrell MRN: [id number] DOB: October 03, 1940 Today's Date: 03/17/2016    History of present illness Pt is a 75 y/o female s/p L3-5 foraminotomies and PLIF. PMHx: hypertension, hypothyrodism, back sx   OT comments  Pt is at adequate level to d/c with family support.   Follow Up Recommendations  No OT follow up;Supervision - Intermittent    Equipment Recommendations  3 in 1 bedside comode    Recommendations for Other Services      Precautions / Restrictions Precautions Precautions: Back Precaution Booklet Issued: Yes (comment) Precaution Comments: back precautions reviewed Required Braces or Orthoses: Spinal Brace Spinal Brace: Lumbar corset;Applied in sitting position;Other (comment)       Mobility Bed Mobility Overal bed mobility: Needs Assistance Bed Mobility: Sit to Sidelying         Sit to sidelying: Min guard General bed mobility comments: Min guard assist to help lift bottom leg last 2" up into the bed when going to side lying from sitting.   Transfers Overall transfer level: Needs assistance Equipment used: 1 person hand held assist Transfers: Sit to/from Stand Sit to Stand: Min guard         General transfer comment: Min guard assist for safety during transitions.  Verbal cues to scoot to EOB before standing. Spoke with pt/daughter re-bedrails vs no bed rails at home.    Balance Overall balance assessment: Needs assistance Sitting-balance support: Feet supported;Bilateral upper extremity supported Sitting balance-Leahy Scale: Good     Standing balance support: Bilateral upper extremity supported;Single extremity supported Standing balance-Leahy Scale: Fair                     ADL Overall ADL's : Needs assistance/impaired                                       General ADL Comments: back eduation:  Back handout provided and reviewed adls in detail. Pt educated on: clothing  between brace, never sleep in brace, set an alarm at night for medication, avoid sitting for long periods of time, correct bed positioning for sleeping, correct sequence for bed mobility, avoiding lifting more than 5 pounds and never wash directly over incision. Educated on don of tank top and daughter present. Pt plans use to use hip kit for LB dressing. All education is complete and patient indicates understanding.        Vision                     Perception     Praxis      Cognition   Behavior During Therapy: WFL for tasks assessed/performed Overall Cognitive Status: Within Functional Limits for tasks assessed                       Extremity/Trunk Assessment               Exercises     Shoulder Instructions       General Comments      Pertinent Vitals/ Pain       Pain Assessment: No/denies pain Pain Score: 7  Pain Location: incisional Pain Descriptors / Indicators: Aching;Burning;Sore Pain Intervention(s): Limited activity within patient's tolerance;Monitored during session;Repositioned  Home Living  Prior Functioning/Environment              Frequency           Progress Toward Goals  OT Goals(current goals can now be found in the care plan section)     Acute Rehab OT Goals Patient Stated Goal: get back to adventures and see more of [place] OT Goal Formulation: With patient/family Time For Goal Achievement: 03/23/16 Potential to Achieve Goals: Good ADL Goals Pt Will Perform Grooming: with set-up;standing Pt Will Perform Lower Body Dressing: with supervision;with caregiver independent in assisting;with adaptive equipment;sit to/from stand Additional ADL Goal #1: Pt will recall 4/4 back precautions with no more than 1 verbal cue  Plan Discharge plan remains appropriate    Co-evaluation                 End of Session     Activity Tolerance Patient  tolerated treatment well   Patient Left with call bell/phone within reach;with family/visitor present;in bed   Nurse Communication Mobility status        Time: 5217-4715 OT Time Calculation (min): 18 min  Charges: OT General Charges $OT Visit: 1 Procedure OT Treatments $Self Care/Home Management : 8-22 mins  Parke Poisson B 03/17/2016, 12:33 PM   Jeri Modena   OTR/L Pager: [phone] Office: [phone] .

## 2016-03-17 NOTE — Progress Notes (Signed)
Physical Therapy Treatment Patient Details Name: Heather Cantrell A [place] MRN: [id number] DOB: 08/06/1940 Today's Date: 03/17/2016    History of Present Illness Pt is a 75 y/o female s/p L3-5 foraminotomies and PLIF. PMHx: hypertension, hypothyrodism, back sx    PT Comments    Pt needs a little bit of light assist today due to increased low back soreness.  I reinforced (and so did MD) that this is normal and will get slowly, yet progressively better as she heals.  Family will be available for assist at discharge and she is mobilizing well enough to return home.  PT to follow acutely until d/c confirmed.     Follow Up Recommendations  No PT follow up;Supervision for mobility/OOB     Equipment Recommendations  3in1 (PT)    Recommendations for Other Services   NA     Precautions / Restrictions Precautions Precautions: Back Precaution Booklet Issued: Yes (comment) Precaution Comments: back precautions reviewed Required Braces or Orthoses: Spinal Brace Spinal Brace: Lumbar corset;Applied in sitting position;Other (comment)    Mobility  Bed Mobility Overal bed mobility: Needs Assistance Bed Mobility: Sit to Sidelying         Sit to sidelying: Min guard General bed mobility comments: Min guard assist to help lift bottom leg last 2" up into the bed when going to side lying from sitting.   Transfers Overall transfer level: Needs assistance Equipment used: 1 person hand held assist Transfers: Sit to/from Stand Sit to Stand: Min guard         General transfer comment: Min guard assist for safety during transitions.  Verbal cues to scoot to EOB before standing. Spoke with pt/daughter re-bedrails vs no bed rails at home.  Ambulation/Gait Ambulation/Gait assistance: Min guard Ambulation Distance (Feet): 200 Feet Assistive device: 1 person hand held assist Gait Pattern/deviations: Step-through pattern;Staggering left;Staggering right Gait velocity: decreased Gait velocity  interpretation: Below normal speed for age/gender General Gait Details: When min guard hand held assist removed, pt with midlly staggering gait pattern, so returned light hand held assit for stability in the hallway.    Stairs Stairs:  (pt has flat entry to her home)                Balance Overall balance assessment: Needs assistance Sitting-balance support: Feet supported;Bilateral upper extremity supported Sitting balance-Leahy Scale: Good     Standing balance support: Bilateral upper extremity supported;Single extremity supported Standing balance-Leahy Scale: Fair                      Cognition Arousal/Alertness: Awake/alert Behavior During Therapy: WFL for tasks assessed/performed Overall Cognitive Status: Within Functional Limits for tasks assessed                         General Comments General comments (skin integrity, edema, etc.): Educated on progressive walking, back precautions, no lifting, sleeping position, and no sitting for long periods of time.       Pertinent Vitals/Pain Pain Assessment: 0-10 Pain Score: 7  Pain Location: incisional Pain Descriptors / Indicators: Aching;Burning;Sore Pain Intervention(s): Limited activity within patient's tolerance;Monitored during session;Repositioned           PT Goals (current goals can now be found in the care plan section) Acute Rehab PT Goals Patient Stated Goal: get back to adventures and see more of [place] Progress towards PT goals: Progressing toward goals    Frequency    Min 5X/week      PT  Plan Current plan remains appropriate       End of Session Equipment Utilized During Treatment: Back brace Activity Tolerance: Patient limited by pain Patient left: in bed;with call bell/phone within reach;with family/visitor present     Time: 1610-9604 PT Time Calculation (min) (ACUTE ONLY): 20 min  Charges:  $Gait Training: 8-22 mins            Lene Mckay B. Jaremy Nosal, PT, DPT  [phone]   03/17/2016, 11:18 AM

## 2016-03-17 NOTE — Discharge Summary (Signed)
Physician Discharge Summary  Patient ID: Heather Cantrell MRN: [id number] DOB/AGE: May 08, 1941 75 y.o.  Admit date: 03/15/2016 Discharge date: 03/17/2016  Admission Diagnoses:  Discharge Diagnoses:  Active Problems:   Degenerative spondylolisthesis   Discharged Condition: good  Hospital Course: Patient admitted to the hospital where she underwent an uncomplicated two-level lumbar decompression and fusion. Postoperatively she is doing well. Preoperative back and lower extremity pain much improved. Patient standing and walking without difficulty. Ready for discharge home.  Consults:   Significant Diagnostic Studies:   Treatments:   Discharge Exam: Blood pressure 119/78, pulse 73, temperature 98.4 F (36.9 C), resp. rate 18, height 5\' 4"  (1.626 m), weight 77.1 kg (170 lb), SpO2 98 %. Awake and alert. Oriented and appropriate. Cranial nerve function intact. Motor and sensory function extremities normal. Wound clean and dry. Chest and abdomen benign.  Disposition: Final discharge disposition not confirmed     Medication List    TAKE these medications   benazepril 40 MG tablet Commonly known as:  LOTENSIN Take 40 mg by mouth daily.   diazepam 5 MG tablet Commonly known as:  VALIUM Take 1-2 tablets (5-10 mg total) by mouth every 6 (six) hours as needed for muscle spasms.   levothyroxine 150 MCG tablet Commonly known as:  SYNTHROID, LEVOTHROID Take 150 mcg by mouth daily before breakfast.   oxyCODONE-acetaminophen 5-325 MG tablet Commonly known as:  PERCOCET/ROXICET Take 1-2 tablets by mouth every 4 (four) hours as needed for moderate pain.   traMADol 50 MG tablet Commonly known as:  ULTRAM Take 50 mg by mouth every 6 (six) hours as needed for moderate pain.      Follow-up Information    Temple PaciniPOOL,Dekota Shenk A, MD .   Specialty:  Neurosurgery Contact information: [address] [place] [address] [phone]           Signed: Temple PaciniOOL,[place] Behlke  A 03/17/2016, 9:31 AM

## 2016-09-21 ENCOUNTER — Other Ambulatory Visit: Payer: Self-pay | Admitting: Family Medicine

## 2016-09-21 DIAGNOSIS — Z1239 Encounter for other screening for malignant neoplasm of breast: Secondary | ICD-10-CM

## 2016-10-25 ENCOUNTER — Ambulatory Visit
Admission: RE | Admit: 2016-10-25 | Discharge: 2016-10-25 | Disposition: A | Discharge status: Home / Self Care | Payer: Medicare Other | Source: Ambulatory Visit | Attending: Family Medicine | Admitting: Family Medicine

## 2016-10-25 DIAGNOSIS — Z1231 Encounter for screening mammogram for malignant neoplasm of breast: Secondary | ICD-10-CM | POA: Insufficient documentation

## 2016-10-25 DIAGNOSIS — Z1239 Encounter for other screening for malignant neoplasm of breast: Secondary | ICD-10-CM

## 2016-10-29 ENCOUNTER — Inpatient Hospital Stay
Admission: RE | Admit: 2016-10-29 | Discharge: 2016-10-29 | Disposition: A | Discharge status: Home / Self Care | Payer: Self-pay | Source: Ambulatory Visit | Attending: *Deleted | Admitting: *Deleted

## 2016-10-29 ENCOUNTER — Other Ambulatory Visit: Payer: Self-pay | Admitting: *Deleted

## 2016-10-29 DIAGNOSIS — Z9289 Personal history of other medical treatment: Secondary | ICD-10-CM

## 2017-12-08 ENCOUNTER — Other Ambulatory Visit: Payer: Self-pay | Admitting: Family Medicine

## 2017-12-08 DIAGNOSIS — Z1231 Encounter for screening mammogram for malignant neoplasm of breast: Secondary | ICD-10-CM

## 2018-01-17 ENCOUNTER — Ambulatory Visit
Admission: RE | Admit: 2018-01-17 | Discharge: 2018-01-17 | Disposition: A | Discharge status: Home / Self Care | Payer: Medicare Other | Source: Ambulatory Visit | Attending: Family Medicine | Admitting: Family Medicine

## 2018-01-17 DIAGNOSIS — Z1231 Encounter for screening mammogram for malignant neoplasm of breast: Secondary | ICD-10-CM | POA: Diagnosis present

## 2019-03-12 ENCOUNTER — Other Ambulatory Visit: Payer: Self-pay | Admitting: Family Medicine

## 2019-03-12 DIAGNOSIS — Z1231 Encounter for screening mammogram for malignant neoplasm of breast: Secondary | ICD-10-CM

## 2019-04-13 ENCOUNTER — Ambulatory Visit
Admission: RE | Admit: 2019-04-13 | Discharge: 2019-04-13 | Disposition: A | Discharge status: Home / Self Care | Payer: Medicare Other | Source: Ambulatory Visit | Attending: Family Medicine | Admitting: Family Medicine

## 2019-04-13 DIAGNOSIS — Z1231 Encounter for screening mammogram for malignant neoplasm of breast: Secondary | ICD-10-CM | POA: Diagnosis present

## 2019-07-11 ENCOUNTER — Ambulatory Visit: Payer: PRIVATE HEALTH INSURANCE

## 2019-07-19 ENCOUNTER — Ambulatory Visit: Payer: PRIVATE HEALTH INSURANCE

## 2019-07-23 ENCOUNTER — Ambulatory Visit: Payer: Medicare Other

## 2020-03-24 ENCOUNTER — Other Ambulatory Visit: Payer: Self-pay | Admitting: Family Medicine

## 2020-03-24 DIAGNOSIS — Z1231 Encounter for screening mammogram for malignant neoplasm of breast: Secondary | ICD-10-CM

## 2020-04-21 ENCOUNTER — Other Ambulatory Visit: Payer: Self-pay

## 2020-04-21 ENCOUNTER — Ambulatory Visit
Admission: RE | Admit: 2020-04-21 | Discharge: 2020-04-21 | Disposition: A | Discharge status: Home / Self Care | Payer: Medicare Other | Source: Ambulatory Visit | Attending: Family Medicine | Admitting: Family Medicine

## 2020-04-21 DIAGNOSIS — Z1231 Encounter for screening mammogram for malignant neoplasm of breast: Secondary | ICD-10-CM | POA: Diagnosis present

## 2020-04-24 ENCOUNTER — Other Ambulatory Visit: Payer: Self-pay | Admitting: Neurosurgery

## 2020-04-24 DIAGNOSIS — M160 Bilateral primary osteoarthritis of hip: Secondary | ICD-10-CM

## 2020-05-13 ENCOUNTER — Ambulatory Visit
Admission: RE | Admit: 2020-05-13 | Discharge: 2020-05-13 | Disposition: A | Discharge status: Home / Self Care | Payer: Medicare Other | Source: Ambulatory Visit | Attending: Neurosurgery | Admitting: Neurosurgery

## 2020-05-13 DIAGNOSIS — M160 Bilateral primary osteoarthritis of hip: Secondary | ICD-10-CM

## 2021-02-02 ENCOUNTER — Ambulatory Visit (INDEPENDENT_AMBULATORY_CARE_PROVIDER_SITE_OTHER): Payer: Medicare Other | Admitting: Dermatology

## 2021-02-02 ENCOUNTER — Other Ambulatory Visit: Payer: Self-pay

## 2021-02-02 DIAGNOSIS — L720 Epidermal cyst: Secondary | ICD-10-CM

## 2021-02-02 DIAGNOSIS — L988 Other specified disorders of the skin and subcutaneous tissue: Secondary | ICD-10-CM

## 2021-02-02 DIAGNOSIS — L989 Disorder of the skin and subcutaneous tissue, unspecified: Secondary | ICD-10-CM

## 2021-02-02 DIAGNOSIS — L011 Impetiginization of other dermatoses: Secondary | ICD-10-CM

## 2021-02-02 MED ORDER — DOXYCYCLINE HYCLATE 100 MG PO CAPS: 100.0000 mg | ORAL_CAPSULE | Freq: Two times a day (BID) | ORAL | 0 refills | Status: AC

## 2021-02-02 MED ORDER — MUPIROCIN 2 % EX OINT: TOPICAL_OINTMENT | CUTANEOUS | 0 refills | Status: DC

## 2021-02-02 NOTE — Patient Instructions (Addendum)
Start doxycycline 100mg  take 1 capsule by mouth twice daily x 2 weeks, then decrease to daily until finshed.  Doxycycline should be taken with food to prevent nausea. Do not lay down for 30 minutes after taking. Be cautious with sun exposure and use good sun protection while on this medication. Pregnant women should not take this medication.   Start mupirocin ointment - Apply to crusted area on left eyelid twice daily x 2 weeks. Apply to affected area on right lower leg daily with bandage daily until healed.   If you have any questions or concerns for your doctor, please call our main line at [phone] and press option 4 to reach your doctor's medical assistant. If no one answers, please leave a voicemail as directed and we will return your call as soon as possible. Messages left after 4 pm will be answered the following business day.   You may also send [phone] a message via MyChart. We typically respond to MyChart messages within 1-2 business days.  For prescription refills, please ask your pharmacy to contact our office. Our fax number is [phone].  If you have an urgent issue when the clinic is closed that cannot wait until the next business day, you can page your doctor at the number below.    Please note that while we do our best to be available for urgent issues outside of office hours, we are not available 24/7.   If you have an urgent issue and are unable to reach [phone], you may choose to seek medical care at your doctor's office, retail clinic, urgent care center, or emergency room.  If you have a medical emergency, please immediately call 911 or go to the emergency department.  Pager Numbers  - Dr. [place]: [phone]  - Dr. [phone]: [phone]  - Dr. [phone]: [phone]  In the event of inclement weather, please call our main line at [phone] for an update on the status of any delays or closures.  Dermatology Medication Tips: Please keep the boxes that topical  medications come in in order to help keep track of the instructions about where and how to use these. Pharmacies typically print the medication instructions only on the boxes and not directly on the medication tubes.   If your medication is too expensive, please contact our office at [phone] option 4 or send [phone] a message through MyChart.   We are unable to tell what your co-pay for medications will be in advance as this is different depending on your insurance coverage. However, we may be able to find a substitute medication at lower cost or fill out paperwork to get insurance to cover a needed medication.   If a prior authorization is required to get your medication covered by your insurance company, please allow [place] 1-2 business days to complete this process.  Drug prices often vary depending on where the prescription is filled and some pharmacies may offer cheaper prices.  The website www.goodrx.com contains coupons for medications through different pharmacies. The prices here do not account for what the cost may be with help from insurance (it may be cheaper with your insurance), but the website can give you the price if you did not use any insurance.  - You can print the associated coupon and take it with your prescription to the pharmacy.  - You may also stop by our office during regular business hours and pick up a GoodRx coupon card.  - If you need your prescription sent electronically to  a different pharmacy, notify our office through [facility] or by phone at [phone] option 4.

## 2021-02-02 NOTE — Progress Notes (Signed)
   New Patient Visit  Subjective  Heather Cantrell is a 80 y.o. female who presents for the following: Spot (Left upper eyelid, noticed in the middle of June. Came up 3 weeks after she had on false eyelashes. No symptoms.). Seems to be decreasing in size now.  She also had recent trauma R lower leg.   The following portions of the chart were reviewed this encounter and updated as appropriate:       Review of Systems:  No other skin or systemic complaints except as noted in HPI or Assessment and Plan.  Objective  Well appearing patient in no apparent distress; mood and affect are within normal limits.  A focused examination was performed including face, eyelid. Relevant physical exam findings are noted in the Assessment and Plan.  Left Upper Eyelid 5.33mm slightly raised flesh colored sub q nodule with mild crusting inferior.     right pretibia Crusted erosion with surrounding mild erythema. H/o recent trauma   Assessment & Plan  Epidermal cyst Left Upper Eyelid  Resolving Inflamed Cyst Vs Hidrocystoma  Start doxycycline 100mg  take 1 po BID with food x 2 weeks, then decrease to QD until finished dsp #60 0Rf.  Start mupirocin ointment to crusted area on left upper eyelid twice daily until improved.   If spot grows/becomes symptomatic, would recommend eval by eye surgeon for removal  Doxycycline should be taken with food to prevent nausea. Do not lay down for 30 minutes after taking. Be cautious with sun exposure and use good sun protection while on this medication. Pregnant women should not take this medication.    doxycycline (VIBRAMYCIN) 100 MG capsule - Left Upper Eyelid Take 1 capsule (100 mg total) by mouth 2 (two) times daily. Take with food.  Skin erosion right pretibia  With impetiginization, Secondary to recent trauma.  Discussed gentle cleansing followed by mupirocin ointment daily and cover. Start doxycycline 100mg  1 po BID x 2 wks, then decrease to QD until  finished.  Mupirocin ointment applied today followed by [place] wrap.  mupirocin ointment (BACTROBAN) 2 % - right pretibia Apply to affected area leg daily with bandage change. Also apply to area on left eyelid daily.  Return if symptoms worsen or fail to improve.  I , CMA, am acting as scribe for , MD . Documentation: I have reviewed the above documentation for accuracy and completeness, and I agree with the above.  Cherlyn Labella MD

## 2022-08-11 ENCOUNTER — Ambulatory Visit (INDEPENDENT_AMBULATORY_CARE_PROVIDER_SITE_OTHER): Payer: Medicare Other

## 2022-08-11 ENCOUNTER — Ambulatory Visit
Admission: EM | Admit: 2022-08-11 | Discharge: 2022-08-11 | Disposition: A | Discharge status: Home / Self Care | Payer: Medicare Other

## 2022-08-11 DIAGNOSIS — M79622 Pain in left upper arm: Secondary | ICD-10-CM | POA: Diagnosis not present

## 2022-08-11 NOTE — ED Triage Notes (Addendum)
Patient to Urgent Care with complaints of left upper arm pain after a mechanical fall three weeks ago. States that pain has continued to worsen. Has been treating with tylenol.   Describes pain from shoulder to elbow, throbbing pain.

## 2022-08-11 NOTE — Discharge Instructions (Addendum)
Take Tylenol or ibuprofen as directed.  Rest and elevate your arm.  Apply ice packs 2-3 times a day for up to 15 minutes each.  Wear the sling as needed for comfort.    Follow up with an orthopedist such as the one listed below.

## 2022-08-11 NOTE — ED Provider Notes (Signed)
Roderic Palau    CSN: [id number] Arrival date & time: 08/11/22  1105      History   Chief Complaint Chief Complaint  Patient presents with   Arm Injury    HPI Heather Cantrell is a 82 y.o. female.  Patient presents with left arm pain after she fell 3 weeks ago.  She tripped and fell on a sidewalk; she landed on her left arm.  The pain has been persistent and worse at night.  Treatment attempted with Tylenol.  She denies numbness, weakness, open wounds, redness, bruising, or other symptoms.  Her medical history includes hypertension, hyperlipidemia, hypothyroidism, osteoporosis, chronic low back pain.  The history is provided by the patient and medical records.    Past Medical History:  Diagnosis Date   Hypertension    Hypothyroidism    Spondylolisthesis at L4-L5 level    Wears dentures    full set    Patient Active Problem List   Diagnosis Date Noted   Degenerative spondylolisthesis 03/15/2016    Past Surgical History:  Procedure Laterality Date   BACK SURGERY     CATARACT EXTRACTION W/ INTRAOCULAR LENS IMPLANT     B/L   COLONOSCOPY W/ BIOPSIES AND POLYPECTOMY     MULTIPLE TOOTH EXTRACTIONS     TONSILLECTOMY      OB History   No obstetric history on file.      Home Medications    Prior to Admission medications   Medication Sig Start Date End Date Taking? Authorizing Provider  alendronate (FOSAMAX) 70 MG tablet Take by mouth. 01/20/22 01/20/23 Yes [provider]  pregabalin (LYRICA) 50 MG capsule TAKE 1 TO 2 CAPSULES BY MOUTH NIGHTLY 05/12/22  Yes [provider]  benazepril (LOTENSIN) 40 MG tablet Take 40 mg by mouth daily.    [provider]  cholecalciferol (VITAMIN D3) 10 MCG (400 UNIT) TABS tablet Take by mouth.    [provider]  diazepam (VALIUM) 5 MG tablet Take 1-2 tablets (5-10 mg total) by mouth every 6 (six) hours as needed for muscle spasms. Patient not taking: Reported on 08/11/2022 03/17/16   Earnie Larsson,  MD  EUTHYROX 88 MCG tablet Take 88 mcg by mouth every morning.    [provider]  levothyroxine (SYNTHROID, LEVOTHROID) 150 MCG tablet Take 150 mcg by mouth daily before breakfast. Patient not taking: Reported on 08/11/2022    [provider]  mupirocin ointment (BACTROBAN) 2 % Apply to affected area leg daily with bandage change. Also apply to area on left eyelid daily. Patient not taking: Reported on 08/11/2022 02/02/21   Brendolyn Patty, MD  oxyCODONE-acetaminophen (PERCOCET/ROXICET) 5-325 MG tablet Take 1-2 tablets by mouth every 4 (four) hours as needed for moderate pain. Patient not taking: Reported on 08/11/2022 03/17/16   Earnie Larsson, MD  traMADol (ULTRAM) 50 MG tablet Take 50 mg by mouth every 6 (six) hours as needed for moderate pain. Patient not taking: Reported on 08/11/2022    [provider]    Family History Family History  Problem Relation Age of Onset   Cancer Mother        ovarian or uterine not sure   Cancer Father    Breast cancer Neg Hx     Social History Social History   Tobacco Use   Smoking status: Never   Smokeless tobacco: Never  Substance Use Topics   Alcohol use: Yes    Comment: rare   Drug use: No     Allergies  No allergies on file   Review of Systems Review of Systems  Constitutional:  Negative for chills and fever.  Musculoskeletal:  Positive for arthralgias. Negative for joint swelling.  Skin:  Negative for color change, rash and wound.  Neurological:  Negative for weakness and numbness.  All other systems reviewed and are negative.    Physical Exam Triage Vital Signs ED Triage Vitals  Enc Vitals Group     BP      Pulse      Resp      Temp      Temp src      SpO2      Weight      Height      Head Circumference      Peak Flow      Pain Score      Pain Loc      Pain Edu?      Excl. in [place]?    No data found.  Updated Vital Signs BP (!) 143/88   Pulse 84   Temp 98 F (36.7 C)   Resp 18   SpO2  95%   Visual Acuity Right Eye Distance:   Left Eye Distance:   Bilateral Distance:    Right Eye Near:   Left Eye Near:    Bilateral Near:     Physical Exam Vitals and nursing note reviewed.  Constitutional:      General: She is not in acute distress.    Appearance: Normal appearance. She is well-developed. She is not ill-appearing.  HENT:     Mouth/Throat:     Mouth: Mucous membranes are moist.  Cardiovascular:     Rate and Rhythm: Normal rate and regular rhythm.     Heart sounds: Normal heart sounds.  Pulmonary:     Effort: Pulmonary effort is normal. No respiratory distress.     Breath sounds: Normal breath sounds.  Musculoskeletal:        General: Tenderness present. No swelling, deformity or signs of injury.       Arms:     Cervical back: Neck supple.     Comments: Decreased internal rotation of left shoulder due to discomfort.  FROM of elbow, wrist, fingers. 2+ pulses, sensation intact, strength 5/5.   Skin:    General: Skin is warm and dry.     Capillary Refill: Capillary refill takes less than 2 seconds.     Findings: No bruising, erythema, lesion or rash.  Neurological:     General: No focal deficit present.     Mental Status: She is alert and oriented to person, place, and time.     Sensory: No sensory deficit.     Motor: No weakness.  Psychiatric:        Mood and Affect: Mood normal.        Behavior: Behavior normal.      UC Treatments / Results  Labs (all labs ordered are listed, but only abnormal results are displayed) Labs Reviewed - No data to display  EKG   Radiology DG Humerus Left  Result Date: 08/11/2022 CLINICAL DATA:  Pain.  Status post fall 3 weeks ago EXAM: LEFT HUMERUS - 3 VIEW COMPARISON:  None Available. FINDINGS: No fracture or dislocation. Preserved bone mineralization. Mild degenerative changes of the [facility] joint with osteophytes. IMPRESSION: No acute osseous abnormality Electronically Signed   By: Jill Side M.D.   On: 08/11/2022  11:51    Procedures Procedures (including critical care time)  Medications Ordered  in UC Medications - No data to display  Initial Impression / Assessment and Plan / UC Course  I have reviewed the triage vital signs and the nursing notes.  Pertinent labs & imaging results that were available during my care of the patient were reviewed by me and considered in my medical decision making (see chart for details).    Left upper arm pain.  Xray negative.  Treating with rest, elevation, ice packs, Tylenol or ibuprofen.  Also treating with arm sling as needed for comfort.  Education provided on musculoskeletal pain.  Instructed patient to follow-up with orthopedist.  Contact information for on-call Ortho provided.  Patient agrees to plan of care.   Final Clinical Impressions(s) / UC Diagnoses   Final diagnoses:  Pain of left upper arm     Discharge Instructions      Take Tylenol or ibuprofen as directed.  Rest and elevate your arm.  Apply ice packs 2-3 times a day for up to 15 minutes each.  Wear the sling as needed for comfort.    Follow up with an orthopedist such as the one listed below.        ED Prescriptions   None    PDMP not reviewed this encounter.   Sharion Balloon, NP 08/11/22 [phone]

## 2022-08-19 ENCOUNTER — Ambulatory Visit (INDEPENDENT_AMBULATORY_CARE_PROVIDER_SITE_OTHER): Payer: Medicare Other | Admitting: Family Medicine

## 2022-08-19 ENCOUNTER — Encounter: Payer: Self-pay | Admitting: Family Medicine

## 2022-08-19 VITALS — BP 122/84 | HR 74 | Temp 97.9°F | Ht 63.0 in | Wt 180.0 lb

## 2022-08-19 DIAGNOSIS — M5441 Lumbago with sciatica, right side: Secondary | ICD-10-CM

## 2022-08-19 DIAGNOSIS — M5442 Lumbago with sciatica, left side: Secondary | ICD-10-CM

## 2022-08-19 DIAGNOSIS — I10 Essential (primary) hypertension: Secondary | ICD-10-CM | POA: Insufficient documentation

## 2022-08-19 DIAGNOSIS — G8929 Other chronic pain: Secondary | ICD-10-CM

## 2022-08-19 DIAGNOSIS — E782 Mixed hyperlipidemia: Secondary | ICD-10-CM | POA: Diagnosis not present

## 2022-08-19 DIAGNOSIS — Z6831 Body mass index (BMI) 31.0-31.9, adult: Secondary | ICD-10-CM

## 2022-08-19 DIAGNOSIS — M810 Age-related osteoporosis without current pathological fracture: Secondary | ICD-10-CM | POA: Diagnosis not present

## 2022-08-19 DIAGNOSIS — E661 Drug-induced obesity: Secondary | ICD-10-CM | POA: Insufficient documentation

## 2022-08-19 DIAGNOSIS — E039 Hypothyroidism, unspecified: Secondary | ICD-10-CM | POA: Diagnosis not present

## 2022-08-19 DIAGNOSIS — E785 Hyperlipidemia, unspecified: Secondary | ICD-10-CM | POA: Insufficient documentation

## 2022-08-19 NOTE — Assessment & Plan Note (Addendum)
Chronic, restarted Fosamax January 19, 2022  NO SE.

## 2022-08-19 NOTE — Progress Notes (Signed)
Patient ID: Heather Cantrell, female    DOB: Jan 17, 1941, 83 y.o.   MRN: [place]:[id number]  This visit was conducted in person.  BP 122/84 (BP Location: Left Arm, Patient Position: Sitting, Cuff Size: Large)   Pulse 74   Temp 97.9 F (36.6 C)   Ht '5\' 3"'$  (1.6 m)   Wt 180 lb (81.6 kg)   SpO2 95%   BMI 31.89 kg/m    CC:  Chief Complaint  Patient presents with   Establish Care    Subjective:   HPI: Heather Cantrell is a 82 y.o. female presenting on 08/19/2022 for Establish Care  Previous PCP: Jason Fila Last physical: 07/12/2022  Reviewed last office visit from PCP   from July 12, 2022  Hypothyroid, last TSH July 02, 2022 at 2 on Synthroid 88 mcg daily  Hypertension:  Well-controlled benazepril 40 mg daily BP Readings from Last 3 Encounters:  08/19/22 122/84  08/11/22 (!) 143/88  03/17/16 119/78  Using medication without problems or lightheadedness: None Chest pain with exertion: None Edema: None Short of breath: None Average home BPs: well controlled. Other issues:  Hyperlipidemia : Last lipid check January 01, 2022 showing total cholesterol 221, HDL 56, LDL 127 and triglycerides 180.  Had been on lovastatin in the past but had stopped it several years ago.    Osteoporosis: Last bone density January 19, 2022 T-score -3 restarted at that time on Fosamax; she had been on it from 2018-2021 previously.  Chronic left hip and low back pain: On Lyrica 50 mg p.o. nightly, occasionally taking 100 mg.  Has been followed by neurosurgery, Dr. Trenton Gammon and Davy Pique   Recent urgent care visit reviewed from August 11, 2022: Seen at that time with left arm pain following fall due to tripping on sidewalk. Left humeral x-ray without fracture    Relevant past medical, surgical, family and social history reviewed and updated as indicated. Interim medical history since our last visit reviewed. Allergies and medications reviewed and updated. Outpatient Medications Prior to Visit   Medication Sig Dispense Refill   alendronate (FOSAMAX) 70 MG tablet Take by mouth.     benazepril (LOTENSIN) 40 MG tablet Take 40 mg by mouth daily.     cholecalciferol (VITAMIN D3) 10 MCG (400 UNIT) TABS tablet Take by mouth.     EUTHYROX 88 MCG tablet Take 88 mcg by mouth every morning.     pregabalin (LYRICA) 50 MG capsule TAKE 1 TO 2 CAPSULES BY MOUTH NIGHTLY     diazepam (VALIUM) 5 MG tablet Take 1-2 tablets (5-10 mg total) by mouth every 6 (six) hours as needed for muscle spasms. 60 tablet 0   levothyroxine (SYNTHROID, LEVOTHROID) 150 MCG tablet Take 150 mcg by mouth daily before breakfast.     mupirocin ointment (BACTROBAN) 2 % Apply to affected area leg daily with bandage change. Also apply to area on left eyelid daily. 22 g 0   oxyCODONE-acetaminophen (PERCOCET/ROXICET) 5-325 MG tablet Take 1-2 tablets by mouth every 4 (four) hours as needed for moderate pain. 80 tablet 0   traMADol (ULTRAM) 50 MG tablet Take 50 mg by mouth every 6 (six) hours as needed for moderate pain.     No facility-administered medications prior to visit.     Per HPI unless specifically indicated in ROS section below Review of Systems  Constitutional:  Negative for fatigue, fever and unexpected weight change.  HENT:  Negative for congestion, ear pain, sinus pressure, sneezing, sore throat and trouble  swallowing.   Eyes:  Negative for pain and itching.  Respiratory:  Negative for cough, shortness of breath and wheezing.   Cardiovascular:  Negative for chest pain, palpitations and leg swelling.  Gastrointestinal:  Negative for abdominal pain, blood in stool, constipation, diarrhea and nausea.  Genitourinary:  Negative for difficulty urinating, dysuria, hematuria, menstrual problem and vaginal discharge.  Skin:  Negative for rash.  Neurological:  Negative for syncope, weakness, light-headedness, numbness and headaches.  Psychiatric/Behavioral:  Negative for confusion and dysphoric mood. The patient is not  nervous/anxious.    Objective:  BP 122/84 (BP Location: Left Arm, Patient Position: Sitting, Cuff Size: Large)   Pulse 74   Temp 97.9 F (36.6 C)   Ht '5\' 3"'$  (1.6 m)   Wt 180 lb (81.6 kg)   SpO2 95%   BMI 31.89 kg/m   Wt Readings from Last 3 Encounters:  08/19/22 180 lb (81.6 kg)  03/15/16 170 lb (77.1 kg)  03/05/16 170 lb 3 oz (77.2 kg)      Physical Exam Vitals and nursing note reviewed.  Constitutional:      General: She is not in acute distress.    Appearance: Normal appearance. She is well-developed. She is obese. She is not ill-appearing or toxic-appearing.  HENT:     Head: Normocephalic.     Right Ear: Hearing, tympanic membrane, ear canal and external ear normal.     Left Ear: Hearing, tympanic membrane, ear canal and external ear normal.     Nose: Nose normal.  Eyes:     General: Lids are normal. Lids are everted, no foreign bodies appreciated.     Conjunctiva/sclera: Conjunctivae normal.     Pupils: Pupils are equal, round, and reactive to light.  Neck:     Thyroid: No thyroid mass or thyromegaly.     Vascular: No carotid bruit.     Trachea: Trachea normal.  Cardiovascular:     Rate and Rhythm: Normal rate and regular rhythm.     Heart sounds: Normal heart sounds, S1 normal and S2 normal. No murmur heard.    No gallop.  Pulmonary:     Effort: Pulmonary effort is normal. No respiratory distress.     Breath sounds: Normal breath sounds. No wheezing, rhonchi or rales.  Abdominal:     General: Bowel sounds are normal. There is no distension or abdominal bruit.     Palpations: Abdomen is soft. There is no fluid wave or mass.     Tenderness: There is no abdominal tenderness. There is no guarding or rebound.     Hernia: No hernia is present.  Musculoskeletal:     Cervical back: Normal range of motion and neck supple.  Lymphadenopathy:     Cervical: No cervical adenopathy.  Skin:    General: Skin is warm and dry.     Findings: No rash.  Neurological:      Mental Status: She is alert.     Cranial Nerves: No cranial nerve deficit.     Sensory: No sensory deficit.  Psychiatric:        Mood and Affect: Mood is not anxious or depressed.        Speech: Speech normal.        Behavior: Behavior normal. Behavior is cooperative.        Judgment: Judgment normal.       Results for orders placed or performed during the hospital encounter of 03/15/16  CBC  Result Value Ref Range   WBC 10.9 (  H) 4.0 - 10.5 K/uL   RBC 3.71 (L) 3.87 - 5.11 MIL/uL   Hemoglobin 11.0 (L) 12.0 - 15.0 g/dL   HCT 33.8 (L) 36.0 - 46.0 %   MCV 91.1 78.0 - 100.0 fL   MCH 29.6 26.0 - 34.0 pg   MCHC 32.5 30.0 - 36.0 g/dL   RDW 14.1 11.5 - 15.5 %   Platelets 249 150 - 400 K/uL    Assessment and Plan  Acquired hypothyroidism Assessment & Plan: Stable, chronic.  Continue current medication.  last TSH July 02, 2022 at 2 on levothyroxine 88 mcg daily   Primary hypertension Assessment & Plan: Stable, chronic.  Continue current medication.  Benazepril 40 mg p.o. daily   Mixed hyperlipidemia Assessment & Plan:  Last lipid check January 01, 2022 showing total cholesterol 221, HDL 56, LDL 127 and triglycerides 180.  Had been on lovastatin in the past but had stopped it several years ago.    Will try lifestyle changes but if not At goal LDL < 100.. we may want to consider restarting statin medication.   Age-related osteoporosis without current pathological fracture Assessment & Plan: Chronic, restarted Fosamax January 19, 2022  NO SE.   Chronic bilateral low back pain with bilateral sciatica Assessment & Plan:  Followed by [place] neurosurgery, Dr. Doylene Bode.   On pregabalin 50 mg po qHS... helps with pain, minimal SE.   Class 1 drug-induced obesity with serious comorbidity and body mass index (BMI) of 31.0 to 31.9 in adult    Return in about 5 months (around 01/19/2023) for  follow up HTN, High choleterol  with  fating lasb prior.   Eliezer Lofts, MD

## 2022-08-19 NOTE — Assessment & Plan Note (Addendum)
Followed by [place] neurosurgery, Dr. Doylene Bode.   On pregabalin 50 mg po qHS... helps with pain, minimal SE.

## 2022-08-19 NOTE — Assessment & Plan Note (Addendum)
Stable, chronic.  Continue current medication.  last TSH July 02, 2022 at 2 on levothyroxine 88 mcg daily

## 2022-08-19 NOTE — Assessment & Plan Note (Signed)
Stable, chronic.  Continue current medication.  Benazepril 40 mg p.o. daily

## 2022-08-19 NOTE — Assessment & Plan Note (Addendum)
Last lipid check January 01, 2022 showing total cholesterol 221, HDL 56, LDL 127 and triglycerides 180.  Had been on lovastatin in the past but had stopped it several years ago.    Will try lifestyle changes but if not At goal LDL < 100.. we may want to consider restarting statin medication.

## 2022-08-19 NOTE — Patient Instructions (Signed)
Work on low cholesterol diet, avoid animal fats and fired foods.  Get back to regular exercise.. daily walking.

## 2022-09-21 ENCOUNTER — Telehealth: Payer: Self-pay | Admitting: Family Medicine

## 2022-09-21 MED ORDER — BENAZEPRIL HCL 40 MG PO TABS: 40.0000 mg | ORAL_TABLET | Freq: Every day | ORAL | 1 refills | Status: DC

## 2022-09-21 MED ORDER — EUTHYROX 88 MCG PO TABS: 88.0000 ug | ORAL_TABLET | Freq: Every morning | ORAL | 2 refills | Status: DC

## 2022-09-21 NOTE — Telephone Encounter (Signed)
Prescription Request  09/21/2022  LOV: 08/19/2022  What is the name of the medication or equipment?  EUTHYROX 88 MCG tablet  benazepril (LOTENSIN) 40 MG tablet  Have you contacted your pharmacy to request a refill? Yes   Which pharmacy would you like this sent to?  [facility] [address], [place] - [address] [place] [address] Phone: [phone] Fax: [phone]    Patient notified that their request is being sent to the clinical staff for review and that they should receive a response within 2 business days.   Please advise at [facility] [phone]

## 2022-09-21 NOTE — Telephone Encounter (Signed)
Refills sent as requested

## 2022-10-07 IMAGING — CT CT PELVIS W/O CM
2 of 6 series · 12 of 46 positions shown, 14 images · non-contrast
Comparison: None.

CLINICAL DATA: Arthritis with bilateral hip pain.

EXAM:
CT PELVIS WITHOUT CONTRAST
TECHNIQUE: Multidetector CT imaging of the pelvis was performed following the
standard protocol without intravenous contrast.

[Series 8: pelvis 2.00 br40 s3 axial st · axial · 0.62mm/px · z∈[+1057,+1271]mm · 9 of 125 slices shown, 11 images (1 of 2)]
[im 9/125  soft-tissue]
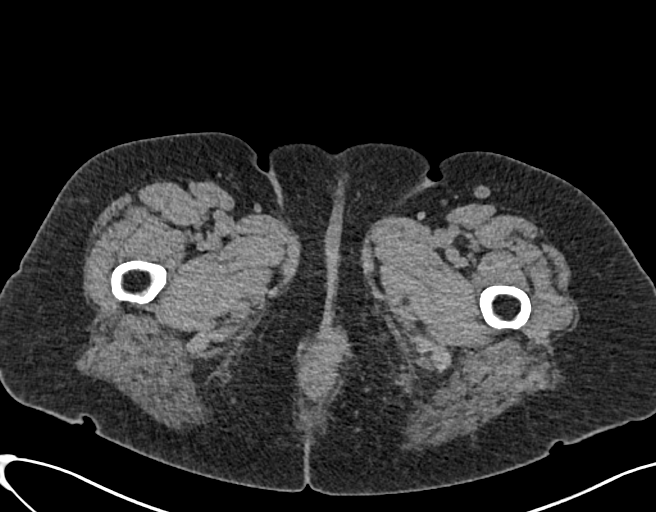
[im 9/125  bone]
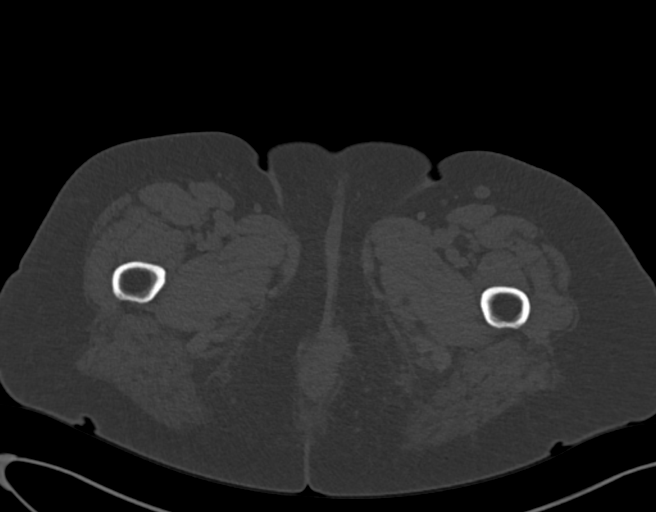
[im 25/125  soft-tissue]
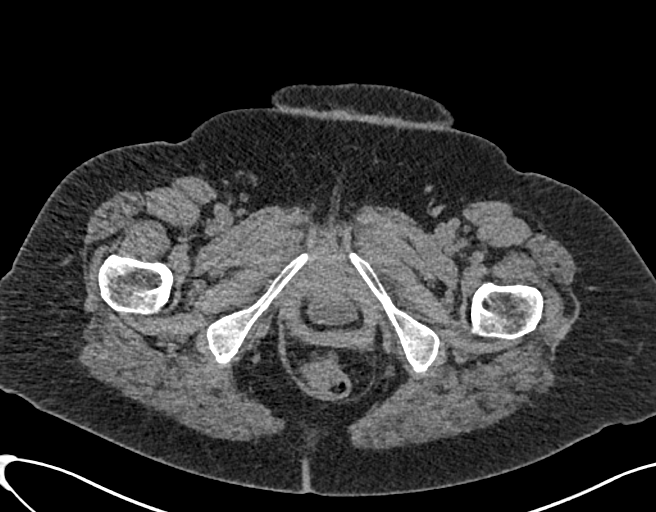
[im 34/125  soft-tissue]
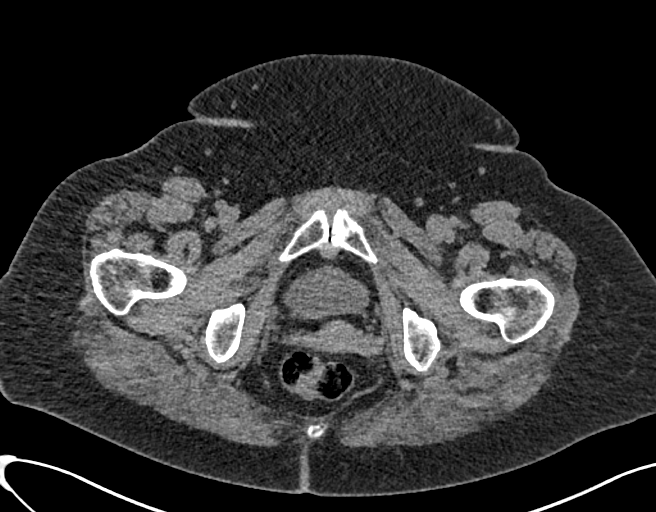
[im 50/125  soft-tissue]
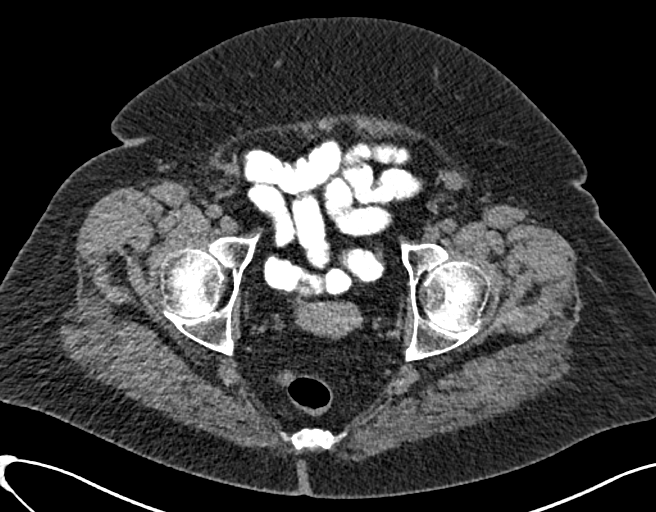
[im 67/125  soft-tissue]
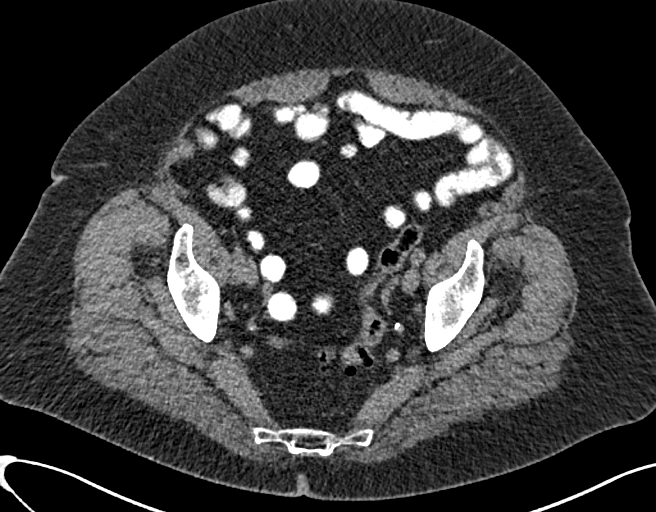
[im 75/125  soft-tissue]
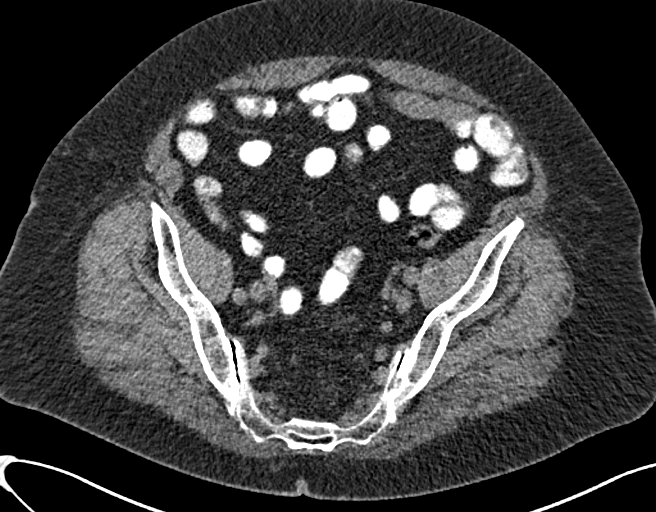
[im 91/125  soft-tissue]
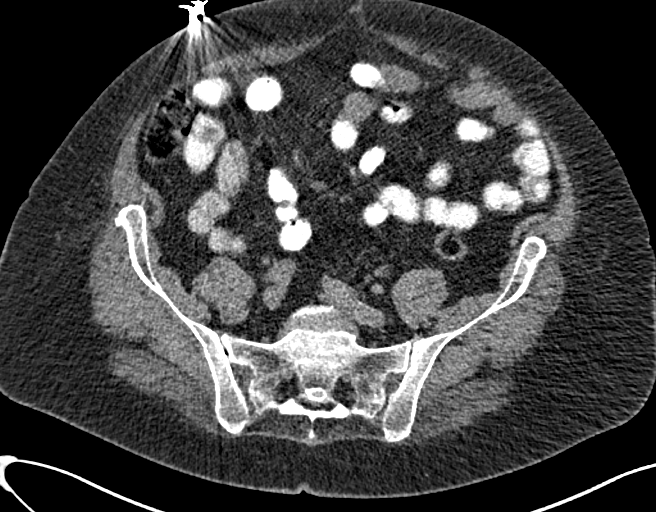
[im 100/125  soft-tissue]
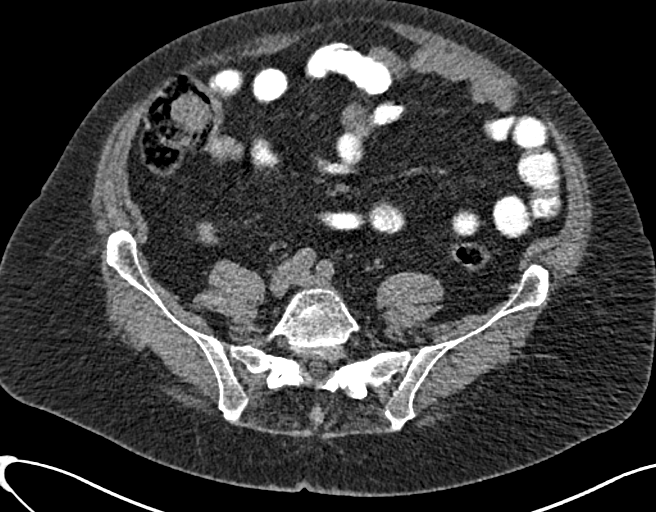
[im 116/125  soft-tissue]
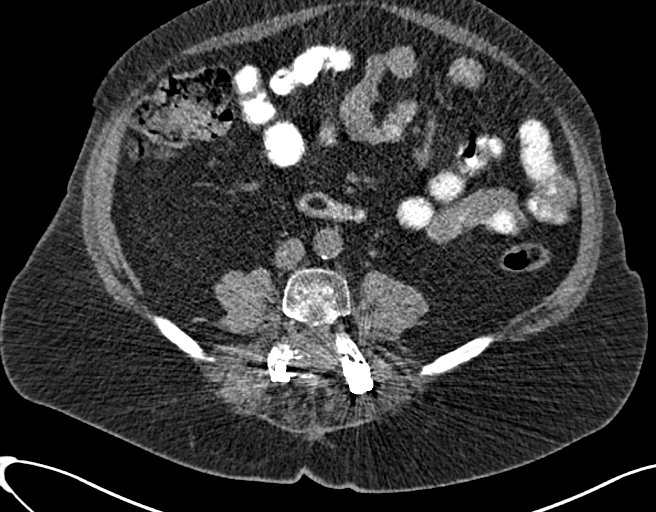
[im 116/125  bone]
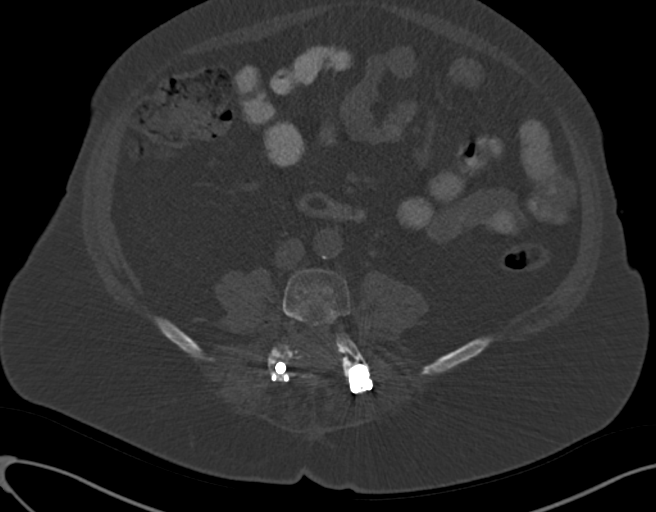

[Series 10: pelvis 2.00 br40 s3 axial st · coronal · 0.49mm/px · 3 of 159 slices shown (2 of 2)]
[im 40/159  soft-tissue]
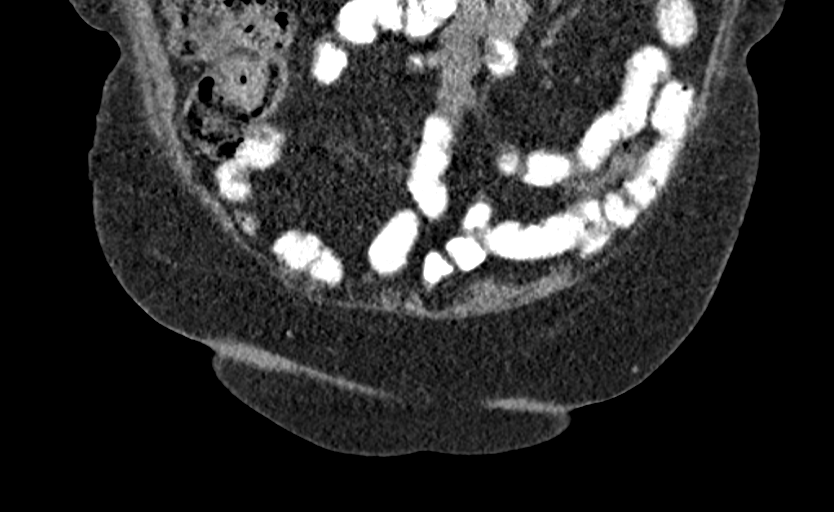
[im 80/159  soft-tissue]
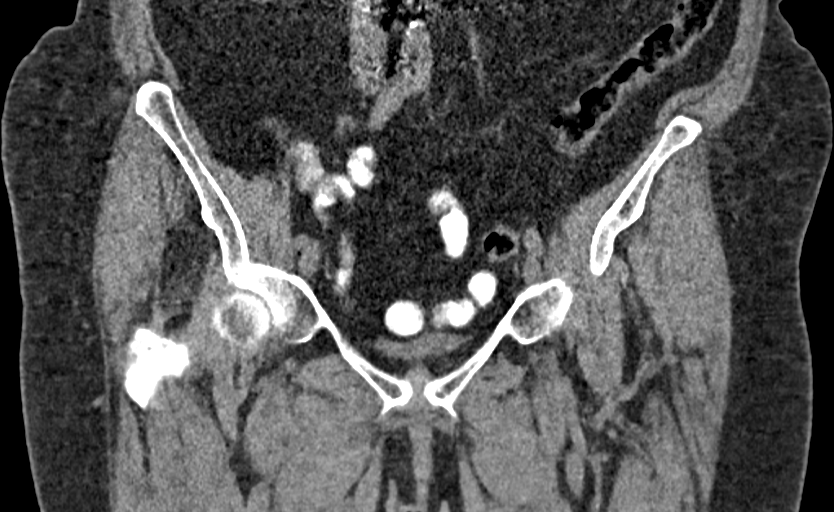
[im 119/159  soft-tissue]
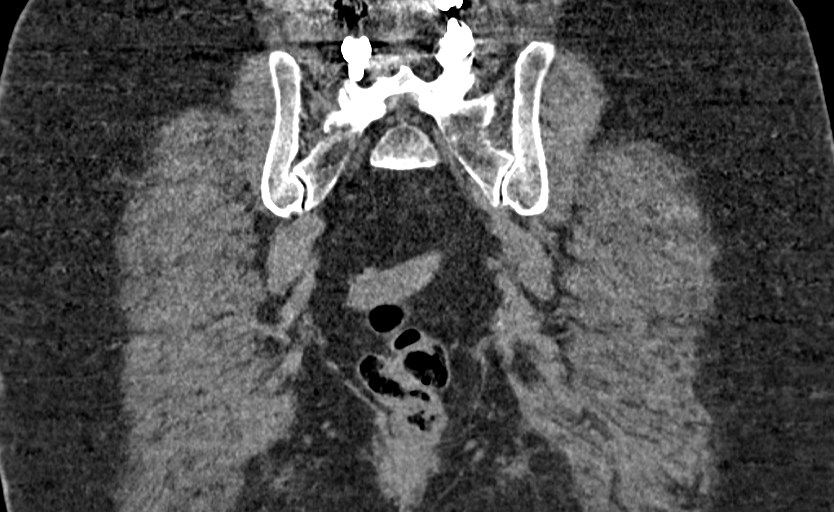

[12 of 46 positions shown; findings below may reference images not displayed]

FINDINGS: Urinary Tract:  Unremarkable.

Bowel:  Unremarkable visualized pelvic bowel loops.

Vascular/Lymphatic: No pathologically enlarged lymph nodes. No
significant vascular abnormality seen.

Reproductive:  Uterus unremarkable.  There is no adnexal mass.

Other:  No intraperitoneal free fluid.

Musculoskeletal: Marked pelvic floor laxity. Lower lumbar fusion
hardware has been incompletely visualized. No suspicious lytic or
sclerotic osseous abnormality. SI joints unremarkable. There is some
minimal loss of joint space in the hips without appreciable
hypertrophic spurring or subchondral sclerosis. No evidence for
femoral neck fracture. Mild degenerative change noted at the
symphysis pubis.
IMPRESSION: 1. Minimal loss of joint space in the hips without appreciable
hypertrophic spurring or subchondral sclerosis.
2. Marked pelvic floor laxity.

## 2022-12-15 ENCOUNTER — Ambulatory Visit (INDEPENDENT_AMBULATORY_CARE_PROVIDER_SITE_OTHER): Payer: Medicare Other

## 2022-12-15 VITALS — Ht 63.0 in | Wt 183.0 lb

## 2022-12-15 DIAGNOSIS — Z0000 Encounter for general adult medical examination without abnormal findings: Secondary | ICD-10-CM

## 2022-12-15 NOTE — Progress Notes (Signed)
Subjective:   Heather Cantrell is a 82 y.o. female who presents for Medicare Annual (Subsequent) preventive examination.  Visit Complete: Virtual  I connected with  [name] on 12/15/22 by a audio enabled telemedicine application and verified that I am speaking with the correct person using two identifiers.  Patient Location: Home  Provider Location: Office/Clinic  I discussed the limitations of evaluation and management by telemedicine. The patient expressed understanding and agreed to proceed.    Review of Systems     Cardiac Risk Factors include: advanced age (>34men, >32 women);dyslipidemia;hypertension;obesity (BMI >30kg/m2)     Objective:    Today's Vitals   12/15/22 1027  Weight: 183 lb (83 kg)  Height: 5\' 3"  (1.6 m)  PainSc: 3    Body mass index is 32.42 kg/m.     12/15/2022   10:33 AM 08/11/2022   11:16 AM 03/15/2016    7:35 AM 03/05/2016    1:28 PM  Advanced Directives  Does Patient Have a Medical Advance Directive? Yes Yes No No  Type of Estate agent of [place];Living will Healthcare Power of [place];Living will    Copy of Healthcare Power of Attorney in Chart? No - copy requested     Would patient like information on creating a medical advance directive?    Yes - Educational materials given    Current Medications (verified) Outpatient Encounter Medications as of 12/15/2022  Medication Sig   alendronate (FOSAMAX) 70 MG tablet Take by mouth.   benazepril (LOTENSIN) 40 MG tablet Take 1 tablet (40 mg total) by mouth daily.   cholecalciferol (VITAMIN D3) 10 MCG (400 UNIT) TABS tablet Take by mouth.   EUTHYROX 88 MCG tablet Take 1 tablet (88 mcg total) by mouth every morning.   pregabalin (LYRICA) 50 MG capsule TAKE 1 TO 2 CAPSULES BY MOUTH NIGHTLY   No facility-administered encounter medications on file as of 12/15/2022.    Allergies (verified) No allergies on file   History: Past Medical History:  Diagnosis Date   Hypertension     Hypothyroidism    Spondylolisthesis at L4-L5 level    Wears dentures    full set   Past Surgical History:  Procedure Laterality Date   BACK SURGERY     CATARACT EXTRACTION W/ INTRAOCULAR LENS IMPLANT     B/L   COLONOSCOPY W/ BIOPSIES AND POLYPECTOMY     MULTIPLE TOOTH EXTRACTIONS     TONSILLECTOMY     Family History  Problem Relation Age of Onset   Cancer Mother        ovarian or uterine not sure   Cancer Father    Cancer Brother 90       esophageal cancer   Breast cancer Neg Hx    Social History   Socioeconomic History   Marital status: Widowed    Spouse name: Not on file   Number of children: 2   Years of education: highschool   Highest education level: Not on file  Occupational History   Not on file  Tobacco Use   Smoking status: Never    Passive exposure: Past   Smokeless tobacco: Never  Vaping Use   Vaping Use: Never used  Substance and Sexual Activity   Alcohol use: Yes    Comment: rare   Drug use: No   Sexual activity: Not on file  Other Topics Concern   Not on file  Social History Narrative    Accounting department,  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       Daughter Eunice Blase) in [place]    Son Eunice) lives at [employer]    She is great Forensic scientist to walk, traveling (goes to [place] every 6 months)   Social Determinants of Health   Financial Resource Strain: Low Risk  (12/15/2022)   Overall Financial Resource Strain (CARDIA)    Difficulty of Paying Living Expenses: Not hard at all  Food Insecurity: No Food Insecurity (12/15/2022)   Hunger Vital Sign    Worried About Running Out of Food in the Last Year: Never true    Ran Out of Food in the Last Year: Never true  Transportation Needs: No Transportation Needs (12/15/2022)   PRAPARE - Administrator, Civil Service (Medical): No    Lack of Transportation (Non-Medical): No  Physical Activity: Insufficiently Active (12/15/2022)   Exercise Vital Sign     Days of Exercise per Week: 2 days    Minutes of Exercise per Session: 20 min  Stress: No Stress Concern Present (12/15/2022)   [employer] of Occupational Health - Occupational Stress Questionnaire    Feeling of Stress : Not at all  Social Connections: Socially Isolated (12/15/2022)   Social Connection and Isolation Panel [NHANES]    Frequency of Communication with Friends and Family: More than three times a week    Frequency of Social Gatherings with Friends and Family: Twice a week    Attends Religious Services: Never    Database administrator or Organizations: No    Attends Banker Meetings: Never    Marital Status: Widowed    Tobacco Counseling Counseling given: Not Answered   Clinical Intake:  Pre-visit preparation completed: Yes  Pain : 0-10 Pain Score: 3  Pain Type: Chronic pain Pain Location: Knee Pain Orientation: Left Pain Descriptors / Indicators: Aching Pain Onset: More than a month ago Pain Frequency: Intermittent     Nutritional Status: BMI > 30  Obese Nutritional Risks: None Diabetes: No  How often do you need to have someone help you when you read instructions, pamphlets, or other written materials from your doctor or pharmacy?: 1 - Never  Interpreter Needed?: No  Information entered by :: NAllen LPN   Activities of Daily Living    12/15/2022   10:28 AM  In your present state of health, do you have any difficulty performing the following activities:  Hearing? 0  Vision? 0  Difficulty concentrating or making decisions? 0  Walking or climbing stairs? 0  Dressing or bathing? 0  Doing errands, shopping? 0  Preparing Food and eating ? N  Using the Toilet? N  In the past six months, have you accidently leaked urine? N  Do you have problems with loss of bowel control? N  Managing your Medications? N  Managing your Finances? N  Housekeeping or managing your Housekeeping? N    Patient Care Team: Excell Seltzer, MD as PCP - General  (Family Medicine)  Indicate any recent Medical Services you may have received from other than [facility] providers in the past year (date may be approximate).     Assessment:   This is a routine wellness examination for Heather Cantrell.  Hearing/Vision screen Hearing Screening - Comments:: Denies hearing issues Vision Screening - Comments:: Regular eye exams, in [place]  Dietary issues and exercise activities discussed:     Goals Addressed  This Visit's Progress    Patient Stated       12/15/2022, wants to lose weight       Depression Screen    12/15/2022   10:34 AM 08/19/2022   11:10 AM  PHQ 2/9 Scores  PHQ - 2 Score 0 0  PHQ- 9 Score 0     Fall Risk    12/15/2022   10:33 AM  Fall Risk   Falls in the past year? 0  Comment tripped over sidewalk  Number falls in past yr: 0  Injury with Fall? 0  Risk for fall due to : Medication side effect  Follow up Falls prevention discussed;Falls evaluation completed    MEDICARE RISK AT HOME:  Medicare Risk at Home - 12/15/22 1034     Any stairs in or around the home? No    If so, are there any without handrails? No    Home free of loose throw rugs in walkways, pet beds, electrical cords, etc? Yes    Adequate lighting in your home to reduce risk of falls? Yes    Life alert? No    Use of a cane, walker or w/c? No    Grab bars in the bathroom? Yes    Shower chair or bench in shower? Yes    Elevated toilet seat or a handicapped toilet? Yes             TIMED UP AND GO:  Was the test performed?  No    Cognitive Function:        12/15/2022   10:35 AM  6CIT Screen  What Year? 0 points  What month? 0 points  What time? 0 points  Count back from 20 0 points  Months in reverse 0 points  Repeat phrase 0 points  Total Score 0 points    Immunizations Immunization History  Administered Date(s) Administered   Influenza-Unspecified 05/17/2016, 04/14/2020, 03/14/2022   PFIZER Comirnaty(Gray Top)Covid-19 Tri-Sucrose  Vaccine 07/20/2019, 08/10/2019   Pfizer Covid-19 Vaccine Bivalent Booster 56yrs & up 03/15/2020, 11/21/2020   Pneumococcal Conjugate-13 07/18/2017   Td 12/23/2020    TDAP status: Up to date  Flu Vaccine status: Up to date  Pneumococcal vaccine status: Up to date  Covid-19 vaccine status: Information provided on how to obtain vaccines.   Qualifies for Shingles Vaccine? Yes   Zostavax completed No   Shingrix Completed?: No.    Education has been provided regarding the importance of this vaccine. Patient has been advised to call insurance company to determine out of pocket expense if they have not yet received this vaccine. Advised may also receive vaccine at local pharmacy or Health Dept. Verbalized acceptance and understanding.  Screening Tests Health Maintenance  Topic Date Due   Zoster Vaccines- Shingrix (1 of 2) Never done   DEXA SCAN  Never done   Pneumonia Vaccine 28+ Years old (2 of 2 - PPSV23 or PCV20) 07/18/2018   COVID-19 Vaccine (5 - 2023-24 season) 02/12/2022   INFLUENZA VACCINE  01/13/2023   Medicare Annual Wellness (AWV)  12/15/2023   DTaP/Tdap/Td (2 - Tdap) 12/24/2030   HPV VACCINES  Aged Out    Health Maintenance  Health Maintenance Due  Topic Date Due   Zoster Vaccines- Shingrix (1 of 2) Never done   DEXA SCAN  Never done   Pneumonia Vaccine 97+ Years old (2 of 2 - PPSV23 or PCV20) 07/18/2018   COVID-19 Vaccine (5 - 2023-24 season) 02/12/2022    Colorectal cancer screening: No longer  required.   Mammogram status: No longer required due to age.  Bone Density status: Completed 2023.   Lung Cancer Screening: (Low Dose CT Chest recommended if Age 52-80 years, 20 pack-year currently smoking OR have quit w/in 15years.) does not qualify.   Lung Cancer Screening Referral: no  Additional Screening:  Hepatitis C Screening: does not qualify;   Vision Screening: Recommended annual ophthalmology exams for early detection of glaucoma and other disorders of the  eye. Is the patient up to date with their annual eye exam?  Yes  Who is the provider or what is the name of the office in which the patient attends annual eye exams? In [place] If pt is not established with a provider, would they like to be referred to a provider to establish care? No .   Dental Screening: Recommended annual dental exams for proper oral hygiene  Diabetic Foot Exam: n/a  Community Resource Referral / Chronic Care Management: CRR required this visit?  No   CCM required this visit?  No     Plan:     I have personally reviewed and noted the following in the patient's chart:   Medical and social history Use of alcohol, tobacco or illicit drugs  Current medications and supplements including opioid prescriptions. Patient is not currently taking opioid prescriptions. Functional ability and status Nutritional status Physical activity Advanced directives List of other physicians Hospitalizations, surgeries, and ER visits in previous 12 months Vitals Screenings to include cognitive, depression, and falls Referrals and appointments  In addition, I have reviewed and discussed with patient certain preventive protocols, quality metrics, and best practice recommendations. A written personalized care plan for preventive services as well as general preventive health recommendations were provided to patient.     Barb Merino, LPN   01/16/1659   After Visit Summary: (Pick Up) Due to this being a telephonic visit, with patients personalized plan was offered to patient and patient has requested to Pick up at office.  Nurse Notes: none

## 2022-12-15 NOTE — Patient Instructions (Signed)
Heather Cantrell , Thank you for taking time to come for your Medicare Wellness Visit. I appreciate your ongoing commitment to your health goals. Please review the following plan we discussed and let me know if I can assist you in the future.   These are the goals we discussed:  Goals      Patient Stated     12/15/2022, wants to lose weight        This is a list of the screening recommended for you and due dates:  Health Maintenance  Topic Date Due   Zoster (Shingles) Vaccine (1 of 2) Never done   DEXA scan (bone density measurement)  Never done   Pneumonia Vaccine (2 of 2 - PPSV23 or PCV20) 07/18/2018   COVID-19 Vaccine (5 - 2023-24 season) 02/12/2022   Flu Shot  01/13/2023   Medicare Annual Wellness Visit  12/15/2023   DTaP/Tdap/Td vaccine (2 - Tdap) 12/24/2030   HPV Vaccine  Aged Out    Advanced directives: Please bring a copy of your POA (Power of [place]) and/or Living Will to your next appointment.   Conditions/risks identified: none  Next appointment: Follow up in one year for your annual wellness visit    Preventive Care 65 Years and Older, Female Preventive care refers to lifestyle choices and visits with your health care provider that can promote health and wellness. What does preventive care include? A yearly physical exam. This is also called an annual well check. Dental exams once or twice a year. Routine eye exams. Ask your health care provider how often you should have your eyes checked. Personal lifestyle choices, including: Daily care of your teeth and gums. Regular physical activity. Eating a healthy diet. Avoiding tobacco and drug use. Limiting alcohol use. Practicing safe sex. Taking low-dose aspirin every day. Taking vitamin and mineral supplements as recommended by your health care provider. What happens during an annual well check? The services and screenings done by your health care provider during your annual well check will depend on your age, overall  health, lifestyle risk factors, and family history of disease. Counseling  Your health care provider may ask you questions about your: Alcohol use. Tobacco use. Drug use. Emotional well-being. Home and relationship well-being. Sexual activity. Eating habits. History of falls. Memory and ability to understand (cognition). Work and work Astronomer. Reproductive health. Screening  You may have the following tests or measurements: Height, weight, and BMI. Blood pressure. Lipid and cholesterol levels. These may be checked every 5 years, or more frequently if you are over 7 years old. Skin check. Lung cancer screening. You may have this screening every year starting at age 98 if you have a 30-pack-year history of smoking and currently smoke or have quit within the past 15 years. Fecal occult blood test (FOBT) of the stool. You may have this test every year starting at age 30. Flexible sigmoidoscopy or colonoscopy. You may have a sigmoidoscopy every 5 years or a colonoscopy every 10 years starting at age 83. Hepatitis C blood test. Hepatitis B blood test. Sexually transmitted disease (STD) testing. Diabetes screening. This is done by checking your blood sugar (glucose) after you have not eaten for a while (fasting). You may have this done every 1-3 years. Bone density scan. This is done to screen for osteoporosis. You may have this done starting at age 47. Mammogram. This may be done every 1-2 years. Talk to your health care provider about how often you should have regular mammograms. Talk with your  health care provider about your test results, treatment options, and if necessary, the need for more tests. Vaccines  Your health care provider may recommend certain vaccines, such as: Influenza vaccine. This is recommended every year. Tetanus, diphtheria, and acellular pertussis (Tdap, Td) vaccine. You may need a Td booster every 10 years. Zoster vaccine. You may need this after age  19. Pneumococcal 13-valent conjugate (PCV13) vaccine. One dose is recommended after age 2. Pneumococcal polysaccharide (PPSV23) vaccine. One dose is recommended after age 50. Talk to your health care provider about which screenings and vaccines you need and how often you need them. This information is not intended to replace advice given to you by your health care provider. Make sure you discuss any questions you have with your health care provider. Document Released: 06/27/2015 Document Revised: 02/18/2016 Document Reviewed: 04/01/2015 Elsevier Interactive Patient Education  2017 [place] Prevention in the Home Falls can cause injuries. They can happen to people of all ages. There are many things you can do to make your home safe and to help prevent falls. What can I do on the outside of my home? Regularly fix the edges of walkways and driveways and fix any cracks. Remove anything that might make you trip as you walk through a door, such as a raised step or threshold. Trim any bushes or trees on the path to your home. Use bright outdoor lighting. Clear any walking paths of anything that might make someone trip, such as rocks or tools. Regularly check to see if handrails are loose or broken. Make sure that both sides of any steps have handrails. Any raised decks and porches should have guardrails on the edges. Have any leaves, snow, or ice cleared regularly. Use sand or salt on walking paths during winter. Clean up any spills in your garage right away. This includes oil or grease spills. What can I do in the bathroom? Use night lights. Install grab bars by the toilet and in the tub and shower. Do not use towel bars as grab bars. Use non-skid mats or decals in the tub or shower. If you need to sit down in the shower, use a plastic, non-slip stool. Keep the floor dry. Clean up any water that spills on the floor as soon as it happens. Remove soap buildup in the tub or shower  regularly. Attach bath mats securely with double-sided non-slip rug tape. Do not have throw rugs and other things on the floor that can make you trip. What can I do in the bedroom? Use night lights. Make sure that you have a light by your bed that is easy to reach. Do not use any sheets or blankets that are too big for your bed. They should not hang down onto the floor. Have a firm chair that has side arms. You can use this for support while you get dressed. Do not have throw rugs and other things on the floor that can make you trip. What can I do in the kitchen? Clean up any spills right away. Avoid walking on wet floors. Keep items that you use a lot in easy-to-reach places. If you need to reach something above you, use a strong step stool that has a grab bar. Keep electrical cords out of the way. Do not use floor polish or wax that makes floors slippery. If you must use wax, use non-skid floor wax. Do not have throw rugs and other things on the floor that can make you trip. What  can I do with my stairs? Do not leave any items on the stairs. Make sure that there are handrails on both sides of the stairs and use them. Fix handrails that are broken or loose. Make sure that handrails are as long as the stairways. Check any carpeting to make sure that it is firmly attached to the stairs. Fix any carpet that is loose or worn. Avoid having throw rugs at the top or bottom of the stairs. If you do have throw rugs, attach them to the floor with carpet tape. Make sure that you have a light switch at the top of the stairs and the bottom of the stairs. If you do not have them, ask someone to add them for you. What else can I do to help prevent falls? Wear shoes that: Do not have high heels. Have rubber bottoms. Are comfortable and fit you well. Are closed at the toe. Do not wear sandals. If you use a stepladder: Make sure that it is fully opened. Do not climb a closed stepladder. Make sure that  both sides of the stepladder are locked into place. Ask someone to hold it for you, if possible. Clearly mark and make sure that you can see: Any grab bars or handrails. First and last steps. Where the edge of each step is. Use tools that help you move around (mobility aids) if they are needed. These include: Canes. Walkers. Scooters. Crutches. Turn on the lights when you go into a dark area. Replace any light bulbs as soon as they burn out. Set up your furniture so you have a clear path. Avoid moving your furniture around. If any of your floors are uneven, fix them. If there are any pets around you, be aware of where they are. Review your medicines with your doctor. Some medicines can make you feel dizzy. This can increase your chance of falling. Ask your doctor what other things that you can do to help prevent falls. This information is not intended to replace advice given to you by your health care provider. Make sure you discuss any questions you have with your health care provider. Document Released: 03/27/2009 Document Revised: 11/06/2015 Document Reviewed: 07/05/2014 Elsevier Interactive Patient Education  2017 [employer].

## 2022-12-29 ENCOUNTER — Telehealth: Payer: Self-pay | Admitting: *Deleted

## 2022-12-29 DIAGNOSIS — E039 Hypothyroidism, unspecified: Secondary | ICD-10-CM

## 2022-12-29 DIAGNOSIS — E782 Mixed hyperlipidemia: Secondary | ICD-10-CM

## 2022-12-29 NOTE — Telephone Encounter (Signed)
-----   Message from Alvina Chou sent at 12/29/2022 10:24 AM EDT ----- Regarding: Lab orders for Wednesday, 7.31.24 Lab orders for a 3 month follow up appt.

## 2023-01-07 NOTE — Addendum Note (Signed)
Addended by: Kerby Nora E on: 01/07/2023 05:32 PM   Modules accepted: Orders

## 2023-01-12 ENCOUNTER — Other Ambulatory Visit (INDEPENDENT_AMBULATORY_CARE_PROVIDER_SITE_OTHER): Payer: Medicare Other

## 2023-01-12 DIAGNOSIS — E782 Mixed hyperlipidemia: Secondary | ICD-10-CM | POA: Diagnosis not present

## 2023-01-12 DIAGNOSIS — E039 Hypothyroidism, unspecified: Secondary | ICD-10-CM | POA: Diagnosis not present

## 2023-01-12 LAB — COMPREHENSIVE METABOLIC PANEL
ALT: 10 U/L (ref 0–35)
AST: 15 U/L (ref 0–37)
Albumin: 3.9 g/dL (ref 3.5–5.2)
Alkaline Phosphatase: 65 U/L (ref 39–117)
BUN: 18 mg/dL (ref 6–23)
CO2: 27 mEq/L (ref 19–32)
Calcium: 9.2 mg/dL (ref 8.4–10.5)
Chloride: 108 mEq/L (ref 96–112)
Creatinine, Ser: 1.02 mg/dL (ref 0.40–1.20)
GFR: 50.59 mL/min — ABNORMAL LOW (ref >60.00)
Glucose, Bld: 88 mg/dL (ref 70–99)
Potassium: 4.8 mEq/L (ref 3.5–5.1)
Sodium: 141 mEq/L (ref 135–145)
Total Bilirubin: 1 mg/dL (ref 0.2–1.2)
Total Protein: 6.8 g/dL (ref 6.0–8.3)

## 2023-01-12 LAB — LDL CHOLESTEROL, DIRECT: Direct LDL: 73 mg/dL

## 2023-01-12 LAB — LIPID PANEL
Cholesterol: 230 mg/dL — ABNORMAL HIGH (ref 0–200)
HDL: 58.3 mg/dL (ref >39.00)
NonHDL: 171.54
Total CHOL/HDL Ratio: 4
Triglycerides: 204 mg/dL — ABNORMAL HIGH (ref 0.0–149.0)
VLDL: 40.8 mg/dL — ABNORMAL HIGH (ref 0.0–40.0)

## 2023-01-12 LAB — T4, FREE: Free T4: 1.25 ng/dL (ref 0.60–1.60)

## 2023-01-12 LAB — TSH: TSH: 1 u[IU]/mL (ref 0.35–5.50)

## 2023-01-12 LAB — T3, FREE: T3, Free: 3.1 pg/mL (ref 2.3–4.2)

## 2023-01-14 NOTE — Progress Notes (Signed)
No critical labs need to be addressed urgently. We will discuss labs in detail at upcoming office visit.   

## 2023-01-19 ENCOUNTER — Encounter: Payer: Self-pay | Admitting: Family Medicine

## 2023-01-19 ENCOUNTER — Ambulatory Visit (INDEPENDENT_AMBULATORY_CARE_PROVIDER_SITE_OTHER): Payer: Medicare Other | Admitting: Family Medicine

## 2023-01-19 VITALS — BP 122/64 | HR 82 | Temp 98.0°F | Ht 63.0 in | Wt 182.0 lb

## 2023-01-19 DIAGNOSIS — I10 Essential (primary) hypertension: Secondary | ICD-10-CM

## 2023-01-19 DIAGNOSIS — E782 Mixed hyperlipidemia: Secondary | ICD-10-CM | POA: Diagnosis not present

## 2023-01-19 DIAGNOSIS — E039 Hypothyroidism, unspecified: Secondary | ICD-10-CM

## 2023-01-19 MED ORDER — BENAZEPRIL HCL 40 MG PO TABS: 40.0000 mg | ORAL_TABLET | Freq: Every day | ORAL | 3 refills | Status: DC

## 2023-01-19 MED ORDER — ALPRAZOLAM 0.25 MG PO TABS: ORAL_TABLET | ORAL | 0 refills | Status: AC

## 2023-01-19 NOTE — Assessment & Plan Note (Signed)
Stable, chronic.  Continue current medication.  on levothyroxine 88 mcg daily

## 2023-01-19 NOTE — Assessment & Plan Note (Signed)
Chronic, LDL at goal off statin.

## 2023-01-19 NOTE — Progress Notes (Signed)
Patient ID: Heather Cantrell, female    DOB: 07-12-1940, 82 y.o.   MRN: [id number]  This visit was conducted in person.  BP 122/64 (BP Location: Left Arm, Patient Position: Sitting, Cuff Size: Normal)   Pulse 82   Temp 98 F (36.7 C) (Temporal)   Ht 5\' 3"  (1.6 m)   Wt 182 lb (82.6 kg)   SpO2 95%   BMI 32.24 kg/m    CC:  Chief Complaint  Patient presents with   Hypertension    Subjective:   HPI: Heather Cantrell is a 82 y.o. female presenting on 01/19/2023 for Hypertension  Hypothyroid, On Synthroid 88 mcg daily Lab Results  Component Value Date   TSH 1.00 01/12/2023   Hypertension:  Well-controlled benazepril 40 mg daily BP Readings from Last 3 Encounters:  01/19/23 122/64  08/19/22 122/84  08/11/22 (!) 143/88  Using medication without problems or lightheadedness: None Chest pain with exertion: None Edema: ome in left knee. Short of breath: None Average home BPs:  not checking at home. Other issues:  Hyperlipidemia : Last lipid check January 01, 2022 showing total cholesterol 221, HDL 56, LDL 127 and triglycerides 409.  Had been on lovastatin in the past but had stopped it several years ago. Lab Results  Component Value Date   CHOL 230 (H) 01/12/2023   HDL 58.30 01/12/2023   LDLDIRECT 73.0 01/12/2023   TRIG 204.0 (H) 01/12/2023   CHOLHDL 4 01/12/2023     Has likely upcoming MRI of left knee... on meloxicam  for left KNee OA.    Relevant past medical, surgical, family and social history reviewed and updated as indicated. Interim medical history since our last visit reviewed. Allergies and medications reviewed and updated. Outpatient Medications Prior to Visit  Medication Sig Dispense Refill   alendronate (FOSAMAX) 70 MG tablet Take by mouth.     cholecalciferol (VITAMIN D3) 10 MCG (400 UNIT) TABS tablet Take by mouth.     EUTHYROX 88 MCG tablet Take 1 tablet (88 mcg total) by mouth every morning. 90 tablet 2   meloxicam (MOBIC) 15 MG tablet Take 15 mg by mouth  daily.     pregabalin (LYRICA) 50 MG capsule TAKE 1 TO 2 CAPSULES BY MOUTH NIGHTLY     benazepril (LOTENSIN) 40 MG tablet Take 1 tablet (40 mg total) by mouth daily. 90 tablet 1   No facility-administered medications prior to visit.     Per HPI unless specifically indicated in ROS section below Review of Systems  Constitutional:  Negative for fatigue, fever and unexpected weight change.  HENT:  Negative for congestion, ear pain, sinus pressure, sneezing, sore throat and trouble swallowing.   Eyes:  Negative for pain and itching.  Respiratory:  Negative for cough, shortness of breath and wheezing.   Cardiovascular:  Negative for chest pain, palpitations and leg swelling.  Gastrointestinal:  Negative for abdominal pain, blood in stool, constipation, diarrhea and nausea.  Genitourinary:  Negative for difficulty urinating, dysuria, hematuria, menstrual problem and vaginal discharge.  Skin:  Negative for rash.  Neurological:  Negative for syncope, weakness, light-headedness, numbness and headaches.  Psychiatric/Behavioral:  Negative for confusion and dysphoric mood. The patient is not nervous/anxious.    Objective:  BP 122/64 (BP Location: Left Arm, Patient Position: Sitting, Cuff Size: Normal)   Pulse 82   Temp 98 F (36.7 C) (Temporal)   Ht 5\' 3"  (1.6 m)   Wt 182 lb (82.6 kg)   SpO2 95%   BMI  32.24 kg/m   Wt Readings from Last 3 Encounters:  01/19/23 182 lb (82.6 kg)  12/15/22 183 lb (83 kg)  08/19/22 180 lb (81.6 kg)      Physical Exam Vitals and nursing note reviewed.  Constitutional:      General: She is not in acute distress.    Appearance: Normal appearance. She is well-developed. She is obese. She is not ill-appearing or toxic-appearing.  HENT:     Head: Normocephalic.     Right Ear: Hearing, tympanic membrane, ear canal and external ear normal.     Left Ear: Hearing, tympanic membrane, ear canal and external ear normal.     Nose: Nose normal.  Eyes:     General:  Lids are normal. Lids are everted, no foreign bodies appreciated.     Conjunctiva/sclera: Conjunctivae normal.     Pupils: Pupils are equal, round, and reactive to light.  Neck:     Thyroid: No thyroid mass or thyromegaly.     Vascular: No carotid bruit.     Trachea: Trachea normal.  Cardiovascular:     Rate and Rhythm: Normal rate and regular rhythm.     Heart sounds: Normal heart sounds, S1 normal and S2 normal. No murmur heard.    No gallop.  Pulmonary:     Effort: Pulmonary effort is normal. No respiratory distress.     Breath sounds: Normal breath sounds. No wheezing, rhonchi or rales.  Abdominal:     General: Bowel sounds are normal. There is no distension or abdominal bruit.     Palpations: Abdomen is soft. There is no fluid wave or mass.     Tenderness: There is no abdominal tenderness. There is no guarding or rebound.     Hernia: No hernia is present.  Musculoskeletal:     Cervical back: Normal range of motion and neck supple.  Lymphadenopathy:     Cervical: No cervical adenopathy.  Skin:    General: Skin is warm and dry.     Findings: No rash.  Neurological:     Mental Status: She is alert.     Cranial Nerves: No cranial nerve deficit.     Sensory: No sensory deficit.  Psychiatric:        Mood and Affect: Mood is not anxious or depressed.        Speech: Speech normal.        Behavior: Behavior normal. Behavior is cooperative.        Judgment: Judgment normal.       Results for orders placed or performed in visit on 01/12/23  TSH  Result Value Ref Range   TSH 1.00 0.35 - 5.50 uIU/mL  T3, free  Result Value Ref Range   T3, Free 3.1 2.3 - 4.2 pg/mL  T4, free  Result Value Ref Range   Free T4 1.25 0.60 - 1.60 ng/dL  Lipid panel  Result Value Ref Range   Cholesterol 230 (H) 0 - 200 mg/dL   Triglycerides 643.3 (H) 0.0 - 149.0 mg/dL   HDL 29.51 >88.41 mg/dL   VLDL 66.0 (H) 0.0 - 63.0 mg/dL   Total CHOL/HDL Ratio 4    NonHDL 171.54   Comprehensive metabolic  panel  Result Value Ref Range   Sodium 141 135 - 145 mEq/L   Potassium 4.8 3.5 - 5.1 mEq/L   Chloride 108 96 - 112 mEq/L   CO2 27 19 - 32 mEq/L   Glucose, Bld 88 70 - 99 mg/dL   BUN 18  6 - 23 mg/dL   Creatinine, Ser 2.95 0.40 - 1.20 mg/dL   Total Bilirubin 1.0 0.2 - 1.2 mg/dL   Alkaline Phosphatase 65 39 - 117 U/L   AST 15 0 - 37 U/L   ALT 10 0 - 35 U/L   Total Protein 6.8 6.0 - 8.3 g/dL   Albumin 3.9 3.5 - 5.2 g/dL   GFR 62.13 (L) >08.65 mL/min   Calcium 9.2 8.4 - 10.5 mg/dL  LDL cholesterol, direct  Result Value Ref Range   Direct LDL 73.0 mg/dL    Assessment and Plan  Acquired hypothyroidism Assessment & Plan: Stable, chronic.  Continue current medication.  on levothyroxine 88 mcg daily   Mixed hyperlipidemia Assessment & Plan:  Chronic, LDL at goal off statin.     Primary hypertension Assessment & Plan: Stable, chronic.  Continue current medication.  Benazepril 40 mg p.o. daily   Other orders -     ALPRAZolam; 1-2 tablet prior to procedure  Dispense: 2 tablet; Refill: 0 -     Benazepril HCl; Take 1 tablet (40 mg total) by mouth daily.  Dispense: 90 tablet; Refill: 3     Return for phone AMW,  fasting labs then CPE with me.   Kerby Nora, MD

## 2023-01-19 NOTE — Assessment & Plan Note (Signed)
Stable, chronic.  Continue current medication.  Benazepril 40 mg p.o. daily

## 2023-02-28 ENCOUNTER — Ambulatory Visit (INDEPENDENT_AMBULATORY_CARE_PROVIDER_SITE_OTHER): Payer: Medicare Other | Admitting: Internal Medicine

## 2023-02-28 ENCOUNTER — Encounter: Payer: Self-pay | Admitting: Internal Medicine

## 2023-02-28 VITALS — BP 120/86 | HR 69 | Temp 97.7°F | Ht 63.0 in | Wt 180.0 lb

## 2023-02-28 DIAGNOSIS — R350 Frequency of micturition: Secondary | ICD-10-CM | POA: Diagnosis not present

## 2023-02-28 LAB — POC URINALSYSI DIPSTICK (AUTOMATED)
Glucose, UA: NEGATIVE
Nitrite, UA: POSITIVE
Protein, UA: POSITIVE — AB
Spec Grav, UA: 1.025 (ref 1.010–1.025)
Urobilinogen, UA: 0.2 U/dL
pH, UA: 5.5 (ref 5.0–8.0)

## 2023-02-28 MED ORDER — CEPHALEXIN 500 MG PO CAPS: 500.0000 mg | ORAL_CAPSULE | Freq: Three times a day (TID) | ORAL | 0 refills | Status: DC

## 2023-02-28 NOTE — Patient Instructions (Signed)
Please start the cephalexin 500mg  three times a day. If your urine symptoms are gone by tomorrow, you can stop it after 3 days--otherwise take the whole week.

## 2023-02-28 NOTE — Addendum Note (Signed)
Addended by: Eual Fines on: 02/28/2023 12:40 PM   Modules accepted: Orders

## 2023-02-28 NOTE — Assessment & Plan Note (Signed)
And urgency Similar to past symptoms Urinalysis 3+ leuks and blood , positive nitrite Past cultures pansensitive E coli Will treat with cephalexin 500 tid---okay to stop after 3 days if symptoms gone by tomorrow

## 2023-02-28 NOTE — Progress Notes (Signed)
Subjective:    Patient ID: Heather Cantrell, female    DOB: 1941/06/14, 82 y.o.   MRN: [id number]  HPI Here due to urinary symptoms  Started 2 nights ago Having urgency and then can't go No dysuria No hematuria Increased frequency  No meds for this  Current Outpatient Medications on File Prior to Visit  Medication Sig Dispense Refill   ALPRAZolam (XANAX) 0.25 MG tablet 1-2 tablet prior to procedure 2 tablet 0   benazepril (LOTENSIN) 40 MG tablet Take 1 tablet (40 mg total) by mouth daily. 90 tablet 3   cholecalciferol (VITAMIN D3) 10 MCG (400 UNIT) TABS tablet Take by mouth.     EUTHYROX 88 MCG tablet Take 1 tablet (88 mcg total) by mouth every morning. 90 tablet 2   meloxicam (MOBIC) 15 MG tablet Take 15 mg by mouth daily.     pregabalin (LYRICA) 50 MG capsule TAKE 1 TO 2 CAPSULES BY MOUTH NIGHTLY     No current facility-administered medications on file prior to visit.    Allergies  Allergen Reactions   No Allergies On File     Past Medical History:  Diagnosis Date   Hypertension    Hypothyroidism    Spondylolisthesis at L4-L5 level    Wears dentures    full set    Past Surgical History:  Procedure Laterality Date   BACK SURGERY     CATARACT EXTRACTION W/ INTRAOCULAR LENS IMPLANT     B/L   COLONOSCOPY W/ BIOPSIES AND POLYPECTOMY     MULTIPLE TOOTH EXTRACTIONS     TONSILLECTOMY      Family History  Problem Relation Age of Onset   Cancer Mother        ovarian or uterine not sure   Cancer Father    Cancer Brother 36       esophageal cancer   Breast cancer Neg Hx     Social History   Socioeconomic History   Marital status: Widowed    Spouse name: Not on file   Number of children: 2   Years of education: highschool   Highest education level: Not on file  Occupational History   Not on file  Tobacco Use   Smoking status: Never    Passive exposure: Past   Smokeless tobacco: Never  Vaping Use   Vaping status: Never Used  Substance and Sexual  Activity   Alcohol use: Yes    Comment: rare   Drug use: No   Sexual activity: Not on file  Other Topics Concern   Not on file  Social History Narrative    Accounting department,  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       Daughter Eunice Blase) in [place]    Son [place]) lives at [employer]    She is great Forensic scientist to walk, traveling (goes to [place] every 6 months)   Social Determinants of Health   Financial Resource Strain: Low Risk  (12/15/2022)   Overall Financial Resource Strain (CARDIA)    Difficulty of Paying Living Expenses: Not hard at all  Food Insecurity: No Food Insecurity (12/15/2022)   Hunger Vital Sign    Worried About Running Out of Food in the Last Year: Never true    Ran Out of Food in the Last Year: Never true  Transportation Needs: No Transportation Needs (12/15/2022)   PRAPARE - Administrator, Civil Service (Medical): No  Lack of Transportation (Non-Medical): No  Physical Activity: Insufficiently Active (12/15/2022)   Exercise Vital Sign    Days of Exercise per Week: 2 days    Minutes of Exercise per Session: 20 min  Stress: No Stress Concern Present (12/15/2022)   [employer] of Occupational Health - Occupational Stress Questionnaire    Feeling of Stress : Not at all  Social Connections: Socially Isolated (12/15/2022)   Social Connection and Isolation Panel [NHANES]    Frequency of Communication with Friends and Family: More than three times a week    Frequency of Social Gatherings with Friends and Family: Twice a week    Attends Religious Services: Never    Database administrator or Organizations: No    Attends Banker Meetings: Never    Marital Status: Widowed  Intimate Partner Violence: Not At Risk (12/15/2022)   Humiliation, Afraid, Rape, and Kick questionnaire    Fear of Current or Ex-Partner: No    Emotionally Abused: No    Physically Abused: No    Sexually Abused: No   Review of  Systems No N/V No fever Slight back pain last night--low back    Objective:   Physical Exam Abdominal:     General: There is no distension.     Palpations: Abdomen is soft.     Tenderness: There is no abdominal tenderness. There is no right CVA tenderness or left CVA tenderness.            Assessment & Plan:

## 2023-03-03 LAB — URINE CULTURE
MICRO NUMBER:: 15471323
SPECIMEN QUALITY:: ADEQUATE

## 2023-03-10 ENCOUNTER — Telehealth: Payer: Medicare Other | Admitting: Family Medicine

## 2023-03-14 ENCOUNTER — Telehealth: Payer: Self-pay

## 2023-03-14 NOTE — Telephone Encounter (Signed)
Left message to call office to see how she is doing after her UTI

## 2023-06-06 ENCOUNTER — Telehealth: Payer: Self-pay

## 2023-06-06 NOTE — Telephone Encounter (Signed)
I spoke with pt and she did not go to UC;pt does not have urinary frequency or back pain now. Pt will cbiof needed. Sending note to DR Ermalene Searing and [employer] pool.

## 2023-06-06 NOTE — Telephone Encounter (Signed)
Reviewed

## 2023-06-21 ENCOUNTER — Other Ambulatory Visit: Payer: Self-pay | Admitting: Family Medicine

## 2023-10-19 ENCOUNTER — Other Ambulatory Visit: Payer: Self-pay | Admitting: Family Medicine

## 2023-10-19 NOTE — Telephone Encounter (Unsigned)
 Copied from CRM [id number]. Topic: Clinical - Medication Refill >> Oct 19, 2023  2:08 PM Heather Cantrell H wrote: Medication: pregabalin (LYRICA) 50 MG capsule  Has the patient contacted their pharmacy? Yes, needs a prescription from provider (Agent: If no, request that the patient contact the pharmacy for the refill. If patient does not wish to contact the pharmacy document the reason why and proceed with request.) (Agent: If yes, when and what did the pharmacy advise?)  This is the patient's preferred pharmacy:  [facility] [address] [place] - [address] [place] [address] Phone: [phone] Fax: [phone]  Is this the correct pharmacy for this prescription? Yes If no, delete pharmacy and type the correct one.   Has the prescription been filled recently? No  Is the patient out of the medication? Yes  Has the patient been seen for an appointment in the last year OR does the patient have an upcoming appointment? Yes  Can we respond through MyChart? Yes  Agent: Please be advised that Rx refills may take up to 3 business days. We ask that you follow-up with your pharmacy.

## 2023-10-19 NOTE — Telephone Encounter (Signed)
 Last Fill: 05/12/22  Last OV: 02/28/23 Next OV: 01/20/24  Routing to provider for review/authorization.

## 2023-10-20 MED ORDER — PREGABALIN 50 MG PO CAPS: ORAL_CAPSULE | ORAL | 1 refills | Status: DC

## 2023-12-15 ENCOUNTER — Telehealth: Payer: Self-pay | Admitting: *Deleted

## 2023-12-15 DIAGNOSIS — E782 Mixed hyperlipidemia: Secondary | ICD-10-CM

## 2023-12-15 DIAGNOSIS — E039 Hypothyroidism, unspecified: Secondary | ICD-10-CM

## 2023-12-15 DIAGNOSIS — M810 Age-related osteoporosis without current pathological fracture: Secondary | ICD-10-CM

## 2023-12-15 NOTE — Telephone Encounter (Signed)
-----   Message from Harlene Du sent at 12/15/2023 11:44 AM EDT ----- Regarding: Labs Fri 01/13/24 Hello,  Patient is coming in for CPE labs on Friday 01/13/24. Can we get orders please.   Thanks

## 2023-12-19 ENCOUNTER — Ambulatory Visit (INDEPENDENT_AMBULATORY_CARE_PROVIDER_SITE_OTHER): Payer: Medicare Other

## 2023-12-19 VITALS — BP 120/86 | Ht 63.0 in | Wt 185.0 lb

## 2023-12-19 DIAGNOSIS — Z2821 Immunization not carried out because of patient refusal: Secondary | ICD-10-CM

## 2023-12-19 DIAGNOSIS — Z0000 Encounter for general adult medical examination without abnormal findings: Secondary | ICD-10-CM | POA: Diagnosis not present

## 2023-12-19 NOTE — Patient Instructions (Signed)
 Heather Cantrell , Thank you for taking time out of your busy schedule to complete your Annual Wellness Visit with me. I enjoyed our conversation and look forward to speaking with you again next year. I, as well as your care team,  appreciate your ongoing commitment to your health goals. Please review the following plan we discussed and let me know if I can assist you in the future. Your Game plan/ To Do List    Referrals: If you haven't heard from the office you've been referred to, please reach out to them at the phone provided.  none Follow up Visits: Next Medicare AWV with our clinical staff: 12/19/2024   Have you seen your provider in the last 6 months (3 months if uncontrolled diabetes)? Yes Next Office Visit with your provider: 01/20/2024  Clinician Recommendations:  Aim for 30 minutes of exercise or brisk walking, 6-8 glasses of water, and 5 servings of fruits and vegetables each day.       This is a list of the screening recommended for you and due dates:  Health Maintenance  Topic Date Due   Zoster (Shingles) Vaccine (1 of 2) Never done   DEXA scan (bone density measurement)  Never done   Pneumococcal Vaccine for age over 73 (2 of 2 - PCV20 or PCV21) 07/18/2018   COVID-19 Vaccine (5 - 2024-25 season) 02/13/2023   Flu Shot  01/13/2024   Medicare Annual Wellness Visit  12/18/2024   DTaP/Tdap/Td vaccine (2 - Tdap) 12/24/2030   Hepatitis B Vaccine  Aged Out   HPV Vaccine  Aged Out   Meningitis B Vaccine  Aged Out    Advanced directives: (Declined) Advance directive discussed with you today. Even though you declined this today, please call our office should you change your mind, and we can give you the proper paperwork for you to fill out. Advance Care Planning is important because it:  [x]  Makes sure you receive the medical care that is consistent with your values, goals, and preferences  [x]  It provides guidance to your family and loved ones and reduces their decisional burden about  whether or not they are making the right decisions based on your wishes.  Follow the link provided in your after visit summary or read over the paperwork we have mailed to you to help you started getting your Advance Directives in place. If you need assistance in completing these, please reach out to us  so that we can help you!  See attachments for Preventive Care and Fall Prevention Tips.

## 2023-12-19 NOTE — Progress Notes (Signed)
 Because this visit was a virtual/telehealth visit,  certain criteria was not obtained, such a blood pressure, CBG if applicable, and timed get up and go. Any medications not marked as taking were not mentioned during the medication reconciliation part of the visit. Any vitals not documented were not able to be obtained due to this being a telehealth visit or patient was unable to self-report a recent blood pressure reading due to a lack of equipment at home via telehealth. Vitals that have been documented are verbally provided by the patient.   This visit was performed by a medical professional under my direct supervision. I was immediately available for consultation/collaboration. I have reviewed and agree with the Annual Wellness Visit documentation.  Subjective:   Heather Cantrell is a 83 y.o. who presents for a Medicare Wellness preventive visit.  As a reminder, Annual Wellness Visits don't include a physical exam, and some assessments may be limited, especially if this visit is performed virtually. We may recommend an in-person follow-up visit with your provider if needed.  Visit Complete: Virtual I connected with  Heather Cantrell on 12/19/23 by a audio enabled telemedicine application and verified that I am speaking with the correct person using two identifiers.  Patient Location: Home  Provider Location: Home Office  I discussed the limitations of evaluation and management by telemedicine. The patient expressed understanding and agreed to proceed.  Vital Signs: Because this visit was a virtual/telehealth visit, some criteria may be missing or patient reported. Any vitals not documented were not able to be obtained and vitals that have been documented are patient reported.  VideoDeclined- This patient declined Librarian, academic. Therefore the visit was completed with audio only.  Persons Participating in Visit: Patient.  AWV Questionnaire: No: Patient Medicare  AWV questionnaire was not completed prior to this visit.  Cardiac Risk Factors include: advanced age (>91men, >35 women);obesity (BMI >30kg/m2);hypertension;dyslipidemia     Objective:    Today's Vitals   12/19/23 1040  BP: 120/86  Weight: 185 lb (83.9 kg)  Height: 5' 3 (1.6 m)   Body mass index is 32.77 kg/m.     12/19/2023   10:39 AM 12/15/2022   10:33 AM 08/11/2022   11:16 AM 03/15/2016    7:35 AM 03/05/2016    1:28 PM  Advanced Directives  Does Patient Have a Medical Advance Directive? Yes Yes Yes No  No   Type of Estate agent of [place];Living will Healthcare Power of [place];Living will Healthcare Power of [place];Living will    Does patient want to make changes to medical advance directive? No - Patient declined      Copy of Healthcare Power of Attorney in Chart? No - copy requested No - copy requested     Would patient like information on creating a medical advance directive?     Yes - Educational materials given      Data saved with a previous flowsheet row definition    Current Medications (verified) Outpatient Encounter Medications as of 12/19/2023  Medication Sig   ALPRAZolam  (XANAX ) 0.25 MG tablet 1-2 tablet prior to procedure   benazepril  (LOTENSIN ) 40 MG tablet Take 1 tablet (40 mg total) by mouth daily.   cephALEXin  (KEFLEX ) 500 MG capsule Take 1 capsule (500 mg total) by mouth 3 (three) times daily.   cholecalciferol (VITAMIN D3) 10 MCG (400 UNIT) TABS tablet Take by mouth.   EUTHYROX  88 MCG tablet TAKE 1 TABLET BY MOUTH IN THE MORNING  meloxicam (MOBIC) 15 MG tablet Take 15 mg by mouth daily.   pregabalin  (LYRICA ) 50 MG capsule TAKE 1 TO 2 CAPSULES BY MOUTH NIGHTLY   No facility-administered encounter medications on file as of 12/19/2023.    Allergies (verified) No allergies on file   History: Past Medical History:  Diagnosis Date   Hypertension    Hypothyroidism    Spondylolisthesis at L4-L5 level    Wears dentures    full  set   Past Surgical History:  Procedure Laterality Date   BACK SURGERY     CATARACT EXTRACTION W/ INTRAOCULAR LENS IMPLANT     B/L   COLONOSCOPY W/ BIOPSIES AND POLYPECTOMY     MULTIPLE TOOTH EXTRACTIONS     TONSILLECTOMY     Family History  Problem Relation Age of Onset   Cancer Mother        ovarian or uterine not sure   Cancer Father    Cancer Brother 74       esophageal cancer   Breast cancer Neg Hx    Social History   Socioeconomic History   Marital status: Widowed    Spouse name: Not on file   Number of children: 2   Years of education: highschool   Highest education level: Not on file  Occupational History   Not on file  Tobacco Use   Smoking status: Never    Passive exposure: Past   Smokeless tobacco: Never  Vaping Use   Vaping status: Never Used  Substance and Sexual Activity   Alcohol use: Yes    Comment: rare   Drug use: No   Sexual activity: Not on file  Other Topics Concern   Not on file  Social History Narrative    Accounting department,  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       Daughter Delsie) in [place]    Son [place]) lives at [employer]    She is great Forensic scientist to walk, traveling (goes to [place] every 6 months)   Social Drivers of Corporate investment banker Strain: Low Risk  (12/19/2023)   Overall Financial Resource Strain (CARDIA)    Difficulty of Paying Living Expenses: Not hard at all  Food Insecurity: No Food Insecurity (12/19/2023)   Hunger Vital Sign    Worried About Running Out of Food in the Last Year: Never true    Ran Out of Food in the Last Year: Never true  Transportation Needs: No Transportation Needs (12/19/2023)   PRAPARE - Administrator, Civil Service (Medical): No    Lack of Transportation (Non-Medical): No  Physical Activity: Insufficiently Active (12/19/2023)   Exercise Vital Sign    Days of Exercise per Week: 2 days    Minutes of Exercise per Session: 20 min   Stress: No Stress Concern Present (12/19/2023)   [employer] of Occupational Health - Occupational Stress Questionnaire    Feeling of Stress: Not at all  Social Connections: Moderately Integrated (12/19/2023)   Social Connection and Isolation Panel    Frequency of Communication with Friends and Family: More than three times a week    Frequency of Social Gatherings with Friends and Family: Twice a week    Attends Religious Services: More than 4 times per year    Active Member of [employer] or Organizations: Yes    Attends Banker Meetings: 1 to 4 times per year    Marital Status:  Widowed    Tobacco Counseling Counseling given: Not Answered    Clinical Intake:  Pre-visit preparation completed: Yes  Pain : No/denies pain     BMI - recorded: 32.77 Nutritional Status: BMI > 30  Obese Nutritional Risks: None Diabetes: No  No results found for: HGBA1C   How often do you need to have someone help you when you read instructions, pamphlets, or other written materials from your doctor or pharmacy?: 1 - Never What is the last grade level you completed in school?: HS Graduate  Interpreter Needed?: No  Information entered by :: Aftan Vint,cma   Activities of Daily Living     12/19/2023   10:43 AM  In your present state of health, do you have any difficulty performing the following activities:  Hearing? 0  Vision? 0  Difficulty concentrating or making decisions? 0  Walking or climbing stairs? 0  Dressing or bathing? 0  Doing errands, shopping? 0  Preparing Food and eating ? N  Using the Toilet? N  In the past six months, have you accidently leaked urine? N  Do you have problems with loss of bowel control? N  Managing your Medications? N  Managing your Finances? N  Housekeeping or managing your Housekeeping? N    Patient Care Team: Avelina Greig BRAVO, MD as PCP - General (Family Medicine) Specialists, [facility] ([facility]thopedic Surgery)  I have  updated your Care Teams any recent Medical Services you may have received from other providers in the past year.     Assessment:   This is a routine wellness examination for Heather Cantrell.  Hearing/Vision screen Hearing Screening - Comments:: No difficulties Vision Screening - Comments:: No vision difficulties    Goals Addressed             This Visit's Progress    Patient Stated   On track    12/15/2022, wants to lose weight       Depression Screen     12/19/2023   10:44 AM 01/19/2023   11:03 AM 12/15/2022   10:34 AM 08/19/2022   11:10 AM  PHQ 2/9 Scores  PHQ - 2 Score 0 0 0 0  PHQ- 9 Score 1 0 0     Fall Risk     12/19/2023   10:43 AM 01/19/2023   11:03 AM 12/15/2022   10:33 AM  Fall Risk   Falls in the past year? 0 1 0  Comment   tripped over sidewalk  Number falls in past yr: 0 0 0  Injury with Fall? 0 1 0  Risk for fall due to : No Fall Risks No Fall Risks Medication side effect  Follow up Falls evaluation completed Falls evaluation completed Falls prevention discussed;Falls evaluation completed    MEDICARE RISK AT HOME:  Medicare Risk at Home Any stairs in or around the home?: Yes If so, are there any without handrails?: No Home free of loose throw rugs in walkways, pet beds, electrical cords, etc?: Yes Adequate lighting in your home to reduce risk of falls?: Yes Life alert?: No Use of a cane, walker or w/c?: No Grab bars in the bathroom?: No Shower chair or bench in shower?: Yes Elevated toilet seat or a handicapped toilet?: Yes  TIMED UP AND GO:  Was the test performed?  No  Cognitive Function: 6CIT completed        12/19/2023   10:42 AM 12/15/2022   10:35 AM  6CIT Screen  What Year? 0 points 0 points  What month? 0 points 0 points  What time? 0 points 0 points  Count back from 20 0 points 0 points  Months in reverse 0 points 0 points  Repeat phrase 0 points 0 points  Total Score 0 points 0 points    Immunizations Immunization History  Administered  Date(s) Administered   Influenza-Unspecified 05/17/2016, 04/14/2020, 03/14/2022   PFIZER Comirnaty(Gray Top)Covid-19 Tri-Sucrose Vaccine 07/20/2019, 08/10/2019   Pfizer Covid-19 Vaccine Bivalent Booster 9yrs & up 03/15/2020, 11/21/2020   Pneumococcal Conjugate-13 07/18/2017   Td 12/23/2020    Screening Tests Health Maintenance  Topic Date Due   Zoster Vaccines- Shingrix (1 of 2) Never done   DEXA SCAN  Never done   Pneumococcal Vaccine: 50+ Years (2 of 2 - PCV20 or PCV21) 07/18/2018   COVID-19 Vaccine (5 - 2024-25 season) 02/13/2023   INFLUENZA VACCINE  01/13/2024   Medicare Annual Wellness (AWV)  12/18/2024   DTaP/Tdap/Td (2 - Tdap) 12/24/2030   Hepatitis B Vaccines  Aged Out   HPV VACCINES  Aged Out   Meningococcal B Vaccine  Aged Out    Health Maintenance  Health Maintenance Due  Topic Date Due   Zoster Vaccines- Shingrix (1 of 2) Never done   DEXA SCAN  Never done   Pneumococcal Vaccine: 50+ Years (2 of 2 - PCV20 or PCV21) 07/18/2018   COVID-19 Vaccine (5 - 2024-25 season) 02/13/2023   Health Maintenance Items Addressed:   Additional Screening:  Vision Screening: Recommended annual ophthalmology exams for early detection of glaucoma and other disorders of the eye. Would you like a referral to an eye doctor? No    Dental Screening: Recommended annual dental exams for proper oral hygiene  Community Resource Referral / Chronic Care Management: CRR required this visit?  No   CCM required this visit?  No   Plan:    I have personally reviewed and noted the following in the patient's chart:   Medical and social history Use of alcohol, tobacco or illicit drugs  Current medications and supplements including opioid prescriptions. Patient is not currently taking opioid prescriptions. Functional ability and status Nutritional status Physical activity Advanced directives List of other physicians Hospitalizations, surgeries, and ER visits in previous 12  months Vitals Screenings to include cognitive, depression, and falls Referrals and appointments  In addition, I have reviewed and discussed with patient certain preventive protocols, quality metrics, and best practice recommendations. A written personalized care plan for preventive services as well as general preventive health recommendations were provided to patient.   Lyle MARLA Right, [place]   12/19/2023   After Visit Summary: (MyChart) Due to this being a telephonic visit, the after visit summary with patients personalized plan was offered to patient via MyChart   Notes: Nothing significant to report at this time.

## 2023-12-21 ENCOUNTER — Other Ambulatory Visit: Payer: Self-pay | Admitting: Family Medicine

## 2023-12-21 NOTE — Telephone Encounter (Signed)
 Medication not available. Euthyrox  has been discontinued.  Okay to change to levothyroxine ?

## 2023-12-23 ENCOUNTER — Other Ambulatory Visit: Payer: Self-pay | Admitting: Family Medicine

## 2024-01-13 ENCOUNTER — Other Ambulatory Visit: Payer: Medicare Other

## 2024-01-13 ENCOUNTER — Ambulatory Visit: Payer: Self-pay | Admitting: Family Medicine

## 2024-01-13 DIAGNOSIS — M810 Age-related osteoporosis without current pathological fracture: Secondary | ICD-10-CM

## 2024-01-13 DIAGNOSIS — E039 Hypothyroidism, unspecified: Secondary | ICD-10-CM

## 2024-01-13 DIAGNOSIS — E782 Mixed hyperlipidemia: Secondary | ICD-10-CM

## 2024-01-13 LAB — LIPID PANEL
Cholesterol: 247 mg/dL — ABNORMAL HIGH (ref 0–200)
HDL: 58.8 mg/dL (ref >39.00)
LDL Cholesterol: 152 mg/dL — ABNORMAL HIGH (ref 0–99)
NonHDL: 187.96
Total CHOL/HDL Ratio: 4
Triglycerides: 178 mg/dL — ABNORMAL HIGH (ref 0.0–149.0)
VLDL: 35.6 mg/dL (ref 0.0–40.0)

## 2024-01-13 LAB — COMPREHENSIVE METABOLIC PANEL WITH GFR
ALT: 11 U/L (ref 0–35)
AST: 16 U/L (ref 0–37)
Albumin: 4.1 g/dL (ref 3.5–5.2)
Alkaline Phosphatase: 68 U/L (ref 39–117)
BUN: 23 mg/dL (ref 6–23)
CO2: 26 meq/L (ref 19–32)
Calcium: 9.3 mg/dL (ref 8.4–10.5)
Chloride: 104 meq/L (ref 96–112)
Creatinine, Ser: 1 mg/dL (ref 0.40–1.20)
GFR: 51.44 mL/min — ABNORMAL LOW (ref >60.00)
Glucose, Bld: 94 mg/dL (ref 70–99)
Potassium: 4.5 meq/L (ref 3.5–5.1)
Sodium: 140 meq/L (ref 135–145)
Total Bilirubin: 0.9 mg/dL (ref 0.2–1.2)
Total Protein: 7 g/dL (ref 6.0–8.3)

## 2024-01-13 LAB — TSH: TSH: 1.37 u[IU]/mL (ref 0.35–5.50)

## 2024-01-13 LAB — T3, FREE: T3, Free: 2.6 pg/mL (ref 2.3–4.2)

## 2024-01-13 LAB — VITAMIN D 25 HYDROXY (VIT D DEFICIENCY, FRACTURES): VITD: 39.2 ng/mL (ref 30.00–100.00)

## 2024-01-13 LAB — T4, FREE: Free T4: 1.05 ng/dL (ref 0.60–1.60)

## 2024-01-13 NOTE — Progress Notes (Signed)
 No critical labs need to be addressed urgently. We will discuss labs in detail at upcoming office visit.

## 2024-01-19 ENCOUNTER — Other Ambulatory Visit: Payer: Self-pay | Admitting: *Deleted

## 2024-01-20 ENCOUNTER — Encounter: Payer: Self-pay | Admitting: Family Medicine

## 2024-01-20 ENCOUNTER — Ambulatory Visit: Payer: Medicare Other | Admitting: Family Medicine

## 2024-01-20 VITALS — BP 162/74 | HR 79 | Temp 97.3°F | Ht 62.75 in | Wt 181.1 lb

## 2024-01-20 DIAGNOSIS — E782 Mixed hyperlipidemia: Secondary | ICD-10-CM | POA: Diagnosis not present

## 2024-01-20 DIAGNOSIS — E661 Drug-induced obesity: Secondary | ICD-10-CM

## 2024-01-20 DIAGNOSIS — M810 Age-related osteoporosis without current pathological fracture: Secondary | ICD-10-CM

## 2024-01-20 DIAGNOSIS — I10 Essential (primary) hypertension: Secondary | ICD-10-CM | POA: Diagnosis not present

## 2024-01-20 DIAGNOSIS — E66811 Obesity, class 1: Secondary | ICD-10-CM | POA: Diagnosis not present

## 2024-01-20 DIAGNOSIS — Z6831 Body mass index (BMI) 31.0-31.9, adult: Secondary | ICD-10-CM

## 2024-01-20 DIAGNOSIS — E039 Hypothyroidism, unspecified: Secondary | ICD-10-CM

## 2024-01-20 DIAGNOSIS — Z23 Encounter for immunization: Secondary | ICD-10-CM | POA: Diagnosis not present

## 2024-01-20 NOTE — Assessment & Plan Note (Signed)
Stable, chronic.  Continue current medication.  on levothyroxine 88 mcg daily

## 2024-01-20 NOTE — Patient Instructions (Signed)
 Please call the location of your choice from the menu below to schedule your Bone Density appointment.    [facility]   Phone:  [phone]   [address]                                                                            [place], [place] [address]                                            Services: 3D Mammogram and Bone Density

## 2024-01-20 NOTE — Assessment & Plan Note (Signed)
Chronic, restarted Fosamax January 19, 2022  NO SE.

## 2024-01-20 NOTE — Assessment & Plan Note (Addendum)
 Chronic poor control off statin.  LDL was at goal < 100 last year... she will get back on track with lifestyle.. she does not wish to restart statin medication. Recheck in 3 to 6 months

## 2024-01-20 NOTE — Progress Notes (Signed)
 Patient ID: Heather Cantrell, female    DOB: [birth year], 83 y.o.   MRN: [id number]  This visit was conducted in person.  BP (!) 150/84   Pulse 79   Temp (!) 97.3 F (36.3 C) (Temporal)   Ht 5' 2.75 (1.594 m)   Wt 181 lb 2 oz (82.2 kg)   SpO2 98%   BMI 32.34 kg/m    CC:  Chief Complaint  Patient presents with   Annual Exam    Part 2 ([facility] 12/19/23)    Subjective:   HPI: Heather Cantrell is a 83 y.o. female presenting on 01/20/2024 for Annual Exam (Part 2 ([facility] 12/19/23)) The patient presents for  review of chronic health problems. He/She also has the following acute concerns today: none  The patient saw a LPN or RN for medicare wellness visit. 12/19/2023  Prevention and wellness was reviewed in detail. Note reviewed and important notes copied below if needed.  Hypothyroid, On Synthroid  88 mcg daily Lab Results  Component Value Date   TSH 1.37 01/13/2024   Hypertension: Above goal in office today on benazepril  40 mg daily BP Readings from Last 3 Encounters:  01/20/24 (!) 150/84  12/19/23 120/86  02/28/23 120/86  Using medication without problems or lightheadedness: None Chest pain with exertion: None Edema: ome in left knee. Short of breath: None Average home BPs:  not checking at home. Other issues:  Hyperlipidemia Had been on lovastatin in the past but had stopped it several years ago.  Poor control off statin but last year LDL 73  She has been  eating lots of butter lately Lab Results  Component Value Date   CHOL 247 (H) 01/13/2024   HDL 58.80 01/13/2024   LDLCALC 152 (H) 01/13/2024   LDLDIRECT 73.0 01/12/2023   TRIG 178.0 (H) 01/13/2024   CHOLHDL 4 01/13/2024  The ASCVD Risk score (Arnett DK, et al., 2019) failed to calculate for the following reasons:   The 2019 ASCVD risk score is only valid for ages 104 to 2  Needs handicap form given trouble walking long distance at times given intermittent knee pain.  I    Relevant past medical, surgical, family and  social history reviewed and updated as indicated. Interim medical history since our last visit reviewed. Allergies and medications reviewed and updated. Outpatient Medications Prior to Visit  Medication Sig Dispense Refill   ALPRAZolam  (XANAX ) 0.25 MG tablet 1-2 tablet prior to procedure 2 tablet 0   benazepril  (LOTENSIN ) 40 MG tablet Take 1 tablet (40 mg total) by mouth daily. 90 tablet 3   cholecalciferol (VITAMIN D3) 10 MCG (400 UNIT) TABS tablet Take by mouth.     levothyroxine  (SYNTHROID ) 88 MCG tablet TAKE 1 TABLET BY MOUTH IN THE MORNING 90 tablet 0   meloxicam (MOBIC) 15 MG tablet Take 15 mg by mouth daily.     pregabalin  (LYRICA ) 50 MG capsule TAKE 1 TO 2 CAPSULES BY MOUTH NIGHTLY 60 capsule 1   No facility-administered medications prior to visit.     Per HPI unless specifically indicated in ROS section below Review of Systems  Constitutional:  Negative for fatigue, fever and unexpected weight change.  HENT:  Negative for congestion, ear pain, sinus pressure, sneezing, sore throat and trouble swallowing.   Eyes:  Negative for pain and itching.  Respiratory:  Negative for cough, shortness of breath and wheezing.   Cardiovascular:  Negative for chest pain, palpitations and leg swelling.  Gastrointestinal:  Negative for abdominal pain, blood  in stool, constipation, diarrhea and nausea.  Genitourinary:  Negative for difficulty urinating, dysuria, hematuria, menstrual problem and vaginal discharge.  Skin:  Negative for rash.  Neurological:  Negative for syncope, weakness, light-headedness, numbness and headaches.  Psychiatric/Behavioral:  Negative for confusion and dysphoric mood. The patient is not nervous/anxious.    Objective:  BP (!) 150/84   Pulse 79   Temp (!) 97.3 F (36.3 C) (Temporal)   Ht 5' 2.75 (1.594 m)   Wt 181 lb 2 oz (82.2 kg)   SpO2 98%   BMI 32.34 kg/m   Wt Readings from Last 3 Encounters:  01/20/24 181 lb 2 oz (82.2 kg)  12/19/23 185 lb (83.9 kg)   02/28/23 180 lb (81.6 kg)      Physical Exam Vitals and nursing note reviewed.  Constitutional:      General: She is not in acute distress.    Appearance: Normal appearance. She is well-developed. She is obese. She is not ill-appearing or toxic-appearing.  HENT:     Head: Normocephalic.     Right Ear: Hearing, tympanic membrane, ear canal and external ear normal.     Left Ear: Hearing, tympanic membrane, ear canal and external ear normal.     Nose: Nose normal.  Eyes:     General: Lids are normal. Lids are everted, no foreign bodies appreciated.     Conjunctiva/sclera: Conjunctivae normal.     Pupils: Pupils are equal, round, and reactive to light.  Neck:     Thyroid : No thyroid  mass or thyromegaly.     Vascular: No carotid bruit.     Trachea: Trachea normal.  Cardiovascular:     Rate and Rhythm: Normal rate and regular rhythm.     Heart sounds: Normal heart sounds, S1 normal and S2 normal. No murmur heard.    No gallop.  Pulmonary:     Effort: Pulmonary effort is normal. No respiratory distress.     Breath sounds: Normal breath sounds. No wheezing, rhonchi or rales.  Abdominal:     General: Bowel sounds are normal. There is no distension or abdominal bruit.     Palpations: Abdomen is soft. There is no fluid wave or mass.     Tenderness: There is no abdominal tenderness. There is no guarding or rebound.     Hernia: No hernia is present.  Musculoskeletal:     Cervical back: Normal range of motion and neck supple.  Lymphadenopathy:     Cervical: No cervical adenopathy.  Skin:    General: Skin is warm and dry.     Findings: No rash.  Neurological:     Mental Status: She is alert.     Cranial Nerves: No cranial nerve deficit.     Sensory: No sensory deficit.  Psychiatric:        Mood and Affect: Mood is not anxious or depressed.        Speech: Speech normal.        Behavior: Behavior normal. Behavior is cooperative.        Judgment: Judgment normal.       Results for  orders placed or performed in visit on 01/13/24  VITAMIN D  25 Hydroxy (Vit-D Deficiency, Fractures)   Collection Time: 01/13/24  8:35 AM  Result Value Ref Range   VITD 39.20 30.00 - 100.00 ng/mL  TSH   Collection Time: 01/13/24  8:35 AM  Result Value Ref Range   TSH 1.37 0.35 - 5.50 uIU/mL  T3, free   Collection Time:  01/13/24  8:35 AM  Result Value Ref Range   T3, Free 2.6 2.3 - 4.2 pg/mL  T4, free   Collection Time: 01/13/24  8:35 AM  Result Value Ref Range   Free T4 1.05 0.60 - 1.60 ng/dL  Lipid panel   Collection Time: 01/13/24  8:35 AM  Result Value Ref Range   Cholesterol 247 (H) 0 - 200 mg/dL   Triglycerides 821.9 (H) 0.0 - 149.0 mg/dL   HDL 41.19 >60.99 mg/dL   VLDL 64.3 0.0 - 59.9 mg/dL   LDL Cholesterol 847 (H) 0 - 99 mg/dL   Total CHOL/HDL Ratio 4    NonHDL 187.96   Comprehensive metabolic panel   Collection Time: 01/13/24  8:35 AM  Result Value Ref Range   Sodium 140 135 - 145 mEq/L   Potassium 4.5 3.5 - 5.1 mEq/L   Chloride 104 96 - 112 mEq/L   CO2 26 19 - 32 mEq/L   Glucose, Bld 94 70 - 99 mg/dL   BUN 23 6 - 23 mg/dL   Creatinine, Ser 8.99 0.40 - 1.20 mg/dL   Total Bilirubin 0.9 0.2 - 1.2 mg/dL   Alkaline Phosphatase 68 39 - 117 U/L   AST 16 0 - 37 U/L   ALT 11 0 - 35 U/L   Total Protein 7.0 6.0 - 8.3 g/dL   Albumin 4.1 3.5 - 5.2 g/dL   GFR 48.55 (L) >39.99 mL/min   Calcium 9.3 8.4 - 10.5 mg/dL    Assessment and Plan The patient's preventative maintenance and recommended screening tests for an annual wellness exam were reviewed in full today. Brought up to date unless services declined.  Counselled on the importance of diet, exercise, and its role in overall health and mortality. The patient's FH and SH was reviewed, including their home life, tobacco status, and drug and alcohol status.   Vaccines: Up-to-date with tetanus 2022, consider shingles vaccine, given Prevnar 20 in office today. Pap/DVE: Not indicated given age Mammo: Not indicated given  age Bone Density: Due Colon: No longer indicated given age Smoking Status: None ETOH/ drug use: None/none  Hep C: Done   Need for vaccination against Streptococcus pneumoniae -     Pneumococcal conjugate vaccine 20-valent  Primary hypertension Assessment & Plan: Chronic.   Above goal in office today.  She will follow her BP at home and call with measurement sin 2 weeks.. if > 140/90 consistently call for medication adjustment.  Benazepril  40 mg p.o. daily   Acquired hypothyroidism Assessment & Plan: Stable, chronic.  Continue current medication.  on levothyroxine  88 mcg daily   Class 1 drug-induced obesity with serious comorbidity and body mass index (BMI) of 31.0 to 31.9 in adult Assessment & Plan: Encouraged exercise, weight loss, healthy eating habits.    Mixed hyperlipidemia Assessment & Plan: Chronic poor control off statin.  LDL was at goal < 100 last year... she will get back on track with lifestyle.. she does not wish to restart statin medication. Recheck in 3 to 6 months   Age-related osteoporosis without current pathological fracture Assessment & Plan: Chronic, restarted Fosamax January 19, 2022  NO SE.  Orders: -     DG Bone Density; Future     Return in about 3 months (around 04/21/2024) for lab only cholesterol check  AND , phone [facility],  fasting labs then CPE with me in 1 year.   Greig Ring, MD

## 2024-01-20 NOTE — Assessment & Plan Note (Signed)
 Encouraged exercise, weight loss, healthy eating habits. ? ?

## 2024-01-20 NOTE — Assessment & Plan Note (Addendum)
 Chronic.   Above goal in office today.  She will follow her BP at home and call with measurement sin 2 weeks.. if > 140/90 consistently call for medication adjustment.  Benazepril  40 mg p.o. daily

## 2024-01-31 ENCOUNTER — Other Ambulatory Visit: Payer: Self-pay | Admitting: *Deleted

## 2024-01-31 ENCOUNTER — Other Ambulatory Visit: Payer: Self-pay | Admitting: Family Medicine

## 2024-01-31 MED ORDER — ALENDRONATE SODIUM 70 MG PO TABS: 70.0000 mg | ORAL_TABLET | ORAL | 3 refills | Status: AC

## 2024-02-08 ENCOUNTER — Other Ambulatory Visit: Payer: Self-pay | Admitting: Family Medicine

## 2024-02-08 NOTE — Telephone Encounter (Signed)
 Last office visit 01/20/2024 for CPE.  Last refilled 10/20/23 for #60 with 1 refill.  Next appt: CPE 01/22/25

## 2024-03-06 ENCOUNTER — Encounter: Payer: Self-pay | Admitting: Family Medicine

## 2024-03-06 ENCOUNTER — Ambulatory Visit (INDEPENDENT_AMBULATORY_CARE_PROVIDER_SITE_OTHER): Admitting: Family Medicine

## 2024-03-06 VITALS — BP 158/80 | HR 71 | Temp 97.8°F | Ht 62.75 in | Wt 179.2 lb

## 2024-03-06 DIAGNOSIS — R051 Acute cough: Secondary | ICD-10-CM | POA: Insufficient documentation

## 2024-03-06 DIAGNOSIS — I10 Essential (primary) hypertension: Secondary | ICD-10-CM | POA: Diagnosis not present

## 2024-03-06 MED ORDER — AZITHROMYCIN 250 MG PO TABS: ORAL_TABLET | ORAL | 0 refills | Status: AC

## 2024-03-06 NOTE — Patient Instructions (Signed)
 Start Mucinex DM (no decongestant) as needed for productive cough. Complete azithromycin  course for possible bacterial superinfection. Rest, push fluids. Go to ER if severe shortness of breath.

## 2024-03-06 NOTE — Assessment & Plan Note (Signed)
 Chronic.   Above goal in office today. Now 2 measurements  Decrease salt and caffeine, ETOH.  She will follow her BP at home and call with measurement in 2 weeks.. if > 140/90 consistently call for medication adjustment.  Benazepril  40 mg p.o. daily

## 2024-03-06 NOTE — Progress Notes (Signed)
 Patient ID: Heather Cantrell, female    DOB: 09-27-1940, 83 y.o.   MRN: [id number]  This visit was conducted in person.  BP (!) 158/80   Pulse 71   Temp 97.8 F (36.6 C) (Temporal)   Ht 5' 2.75 (1.594 m)   Wt 179 lb 4 oz (81.3 kg)   SpO2 96%   BMI 32.01 kg/m    CC:  Chief Complaint  Patient presents with   Cough    X 3 weeks with green phlegm 3-Negative Home Covid Test    Subjective:   HPI: Heather Cantrell is a 83 y.o. female presenting on 03/06/2024 for Cough (X 3 weeks with green phlegm/3-Negative Home Covid Test)   Date of onset: 3 week Initial symptoms included  nasal congestion Symptoms progressed to productive cough.SABRA greenish in last   Occ SOB, none today.   Occ wheeze  No fever  No face pain.  No ear pain.   Sick contacts:  recent trip to [place]. COVID testing:    negative several times     She has tried to treat with  tylenol .     No history of chronic lung disease such as asthma or COPD. Non-smoker.   HTN .SABRA Elevated today despite benazepril . No swelling BP Readings from Last 3 Encounters:  03/06/24 (!) 158/80  01/20/24 (!) 162/74  12/19/23 120/86    Wt Readings from Last 3 Encounters:  03/06/24 179 lb 4 oz (81.3 kg)  01/20/24 181 lb 2 oz (82.2 kg)  12/19/23 185 lb (83.9 kg)         Relevant past medical, surgical, family and social history reviewed and updated as indicated. Interim medical history since our last visit reviewed. Allergies and medications reviewed and updated. Outpatient Medications Prior to Visit  Medication Sig Dispense Refill   alendronate  (FOSAMAX ) 70 MG tablet Take 1 tablet (70 mg total) by mouth once a week. 12 tablet 3   ALPRAZolam  (XANAX ) 0.25 MG tablet 1-2 tablet prior to procedure 2 tablet 0   benazepril  (LOTENSIN ) 40 MG tablet Take 1 tablet (40 mg total) by mouth daily. 90 tablet 3   cholecalciferol (VITAMIN D3) 10 MCG (400 UNIT) TABS tablet Take by mouth.     levothyroxine  (SYNTHROID ) 88 MCG tablet TAKE 1 TABLET  BY MOUTH IN THE MORNING 90 tablet 0   meloxicam (MOBIC) 15 MG tablet Take 15 mg by mouth daily.     pregabalin  (LYRICA ) 50 MG capsule TAKE 1 TO 2 CAPSULES BY MOUTH NIGHTLY 60 capsule 0   No facility-administered medications prior to visit.     Per HPI unless specifically indicated in ROS section below Review of Systems  Constitutional:  Negative for fatigue and fever.  HENT:  Negative for congestion.   Eyes:  Negative for pain.  Respiratory:  Positive for cough and shortness of breath.   Cardiovascular:  Negative for chest pain, palpitations and leg swelling.  Gastrointestinal:  Negative for abdominal pain.  Genitourinary:  Negative for dysuria and vaginal bleeding.  Musculoskeletal:  Negative for back pain.  Neurological:  Negative for syncope, light-headedness and headaches.  Psychiatric/Behavioral:  Negative for dysphoric mood.    Objective:  BP (!) 158/80   Pulse 71   Temp 97.8 F (36.6 C) (Temporal)   Ht 5' 2.75 (1.594 m)   Wt 179 lb 4 oz (81.3 kg)   SpO2 96%   BMI 32.01 kg/m   Wt Readings from Last 3 Encounters:  03/06/24 179 lb 4  oz (81.3 kg)  01/20/24 181 lb 2 oz (82.2 kg)  12/19/23 185 lb (83.9 kg)      Physical Exam Constitutional:      General: She is not in acute distress.    Appearance: Normal appearance. She is well-developed. She is not ill-appearing or toxic-appearing.  HENT:     Head: Normocephalic.     Right Ear: Hearing, tympanic membrane, ear canal and external ear normal. Tympanic membrane is not erythematous, retracted or bulging.     Left Ear: Hearing, tympanic membrane, ear canal and external ear normal. Tympanic membrane is not erythematous, retracted or bulging.     Nose: No mucosal edema or rhinorrhea.     Right Sinus: No maxillary sinus tenderness or frontal sinus tenderness.     Left Sinus: No maxillary sinus tenderness or frontal sinus tenderness.     Mouth/Throat:     Pharynx: Uvula midline.  Eyes:     General: Lids are normal. Lids  are everted, no foreign bodies appreciated.     Conjunctiva/sclera: Conjunctivae normal.     Pupils: Pupils are equal, round, and reactive to light.  Neck:     Thyroid : No thyroid  mass or thyromegaly.     Vascular: No carotid bruit.     Trachea: Trachea normal.  Cardiovascular:     Rate and Rhythm: Normal rate and regular rhythm.     Pulses: Normal pulses.     Heart sounds: Normal heart sounds, S1 normal and S2 normal. No murmur heard.    No friction rub. No gallop.  Pulmonary:     Effort: Pulmonary effort is normal. No tachypnea or respiratory distress.     Breath sounds: Normal breath sounds. No decreased breath sounds, wheezing, rhonchi or rales.  Abdominal:     General: Bowel sounds are normal.     Palpations: Abdomen is soft.     Tenderness: There is no abdominal tenderness.  Musculoskeletal:     Cervical back: Normal range of motion and neck supple.  Skin:    General: Skin is warm and dry.     Findings: No rash.  Neurological:     Mental Status: She is alert.  Psychiatric:        Mood and Affect: Mood is not anxious or depressed.        Speech: Speech normal.        Behavior: Behavior normal. Behavior is cooperative.        Thought Content: Thought content normal.        Judgment: Judgment normal.       Results for orders placed or performed in visit on 01/13/24  VITAMIN D  25 Hydroxy (Vit-D Deficiency, Fractures)   Collection Time: 01/13/24  8:35 AM  Result Value Ref Range   VITD 39.20 30.00 - 100.00 ng/mL  TSH   Collection Time: 01/13/24  8:35 AM  Result Value Ref Range   TSH 1.37 0.35 - 5.50 uIU/mL  T3, free   Collection Time: 01/13/24  8:35 AM  Result Value Ref Range   T3, Free 2.6 2.3 - 4.2 pg/mL  T4, free   Collection Time: 01/13/24  8:35 AM  Result Value Ref Range   Free T4 1.05 0.60 - 1.60 ng/dL  Lipid panel   Collection Time: 01/13/24  8:35 AM  Result Value Ref Range   Cholesterol 247 (H) 0 - 200 mg/dL   Triglycerides 821.9 (H) 0.0 - 149.0 mg/dL    HDL 41.19 >60.99 mg/dL   VLDL 64.3 0.0 -  40.0 mg/dL   LDL Cholesterol 847 (H) 0 - 99 mg/dL   Total CHOL/HDL Ratio 4    NonHDL 187.96   Comprehensive metabolic panel   Collection Time: 01/13/24  8:35 AM  Result Value Ref Range   Sodium 140 135 - 145 mEq/L   Potassium 4.5 3.5 - 5.1 mEq/L   Chloride 104 96 - 112 mEq/L   CO2 26 19 - 32 mEq/L   Glucose, Bld 94 70 - 99 mg/dL   BUN 23 6 - 23 mg/dL   Creatinine, Ser 8.99 0.40 - 1.20 mg/dL   Total Bilirubin 0.9 0.2 - 1.2 mg/dL   Alkaline Phosphatase 68 39 - 117 U/L   AST 16 0 - 37 U/L   ALT 11 0 - 35 U/L   Total Protein 7.0 6.0 - 8.3 g/dL   Albumin 4.1 3.5 - 5.2 g/dL   GFR 48.55 (L) >39.99 mL/min   Calcium 9.3 8.4 - 10.5 mg/dL    Assessment and Plan  Acute cough Assessment & Plan: Start Mucinex DM (no decongestant) as needed for productive cough. Complete azithromycin  course for possible bacterial superinfection. Rest, push fluids. Go to ER if severe shortness of breath.   Primary hypertension Assessment & Plan: Chronic.   Above goal in office today. Now 2 measurements  Decrease salt and caffeine, ETOH.  She will follow her BP at home and call with measurement in 2 weeks.. if > 140/90 consistently call for medication adjustment.  Benazepril  40 mg p.o. daily   Other orders -     Azithromycin ; Take 2 tablets on day 1, then 1 tablet daily on days 2 through 5  Dispense: 6 tablet; Refill: 0    No follow-ups on file.   Greig Ring, MD

## 2024-03-06 NOTE — Assessment & Plan Note (Signed)
 Start Mucinex DM (no decongestant) as needed for productive cough. Complete azithromycin  course for possible bacterial superinfection. Rest, push fluids. Go to ER if severe shortness of breath.

## 2024-03-21 ENCOUNTER — Other Ambulatory Visit: Payer: Self-pay | Admitting: Family Medicine

## 2024-03-29 ENCOUNTER — Telehealth: Payer: Self-pay | Admitting: *Deleted

## 2024-03-29 MED ORDER — AMLODIPINE BESYLATE 5 MG PO TABS: 5.0000 mg | ORAL_TABLET | Freq: Every day | ORAL | 11 refills | Status: AC

## 2024-03-29 NOTE — Addendum Note (Signed)
 Addended by: AVELINA NO E on: 03/29/2024 05:38 PM   Modules accepted: Orders

## 2024-03-29 NOTE — Telephone Encounter (Signed)
 I spoke with pt; pt was seen 03/06/24 and pt has been checking BP that averages 155/77- 80. Pt said this morning before getting busy or eating BP was 160/77. Pt feels fine; no H/A, dizziness, CP, SOB or vision changes. Pt has not missed taking Benazepril  40 mg daily. Pt rechecked BP now BP 184/90 and after resting BP 180/85. Pt has not taken Benazepril  this morning. Pt is going to eat breakfast and then take her med. Pt said she would reck BP after taking BP med and cb if BP continues to rise. Otherwise pt will wait on cb from after Dr Avelina has reviewed this note. UC & ED precautions given and pt voiced understanding. In 03/06/24 office note pt was to cb with BP readings and possible med adjustment., [facility] [address]; pt request cb after reviewed by Dr Avelina.Sending ntoe to Dr Avelina and [place] pool. Sending teams to Amy CMA to let Dr Avelina be aware of this note.

## 2024-03-29 NOTE — Telephone Encounter (Signed)
 Copied from CRM [id number]. Topic: General - Other >> Mar 29, 2024  8:50 AM Vena HERO wrote: Reason for CRM: Pt called in to report her BP today at 160/77. She also wanted to let Dr Avelina know that she has 43 pills left of the benazepril  (LOTENSIN ) 40 MG tablet. She requested that nurse give her a call.

## 2024-03-29 NOTE — Telephone Encounter (Signed)
 Blood pressure continues to be elevated despite benazepril  40 mg daily. If patient has peripheral swelling I would suggest adding hydrochlorothiazide 25 mg daily.  If she has no issues with peripheral swelling I would alternatively suggest amlodipine 5 mg p.o. daily.  Please let me know and I will send a prescription to [facility] for her.

## 2024-03-29 NOTE — Telephone Encounter (Signed)
 I spoke with Heather Cantrell and Heather Cantrell notified as instructed and Heather Cantrell voiced understanding. Heather Cantrell has no swelling in extremities at all. Heather Cantrell just retook her pressure after 1 hr of taking her BP med.  Now BP 155/75. Heather Cantrell said she would take the benazepril  40 mg daily and will add the amlodipine 5 mg po daily. Heather Cantrell will ck with pharmacist to see if should take together or stagger taking the 2 BP meds. Sending note to Dr Avelina and [place] pool.

## 2024-04-22 ENCOUNTER — Telehealth: Payer: Self-pay | Admitting: Family Medicine

## 2024-04-22 DIAGNOSIS — E782 Mixed hyperlipidemia: Secondary | ICD-10-CM

## 2024-04-22 NOTE — Telephone Encounter (Signed)
-----   Message from Harlene Du sent at 04/16/2024  1:52 PM EST ----- Regarding: Lab Mon 04/23/24 Hello,   Patient has a lab appointment on Monday 04/23/24 for cholesterol recheck. Can we get lab orders please.   Thanks

## 2024-04-23 ENCOUNTER — Other Ambulatory Visit (INDEPENDENT_AMBULATORY_CARE_PROVIDER_SITE_OTHER)

## 2024-04-23 DIAGNOSIS — E782 Mixed hyperlipidemia: Secondary | ICD-10-CM | POA: Diagnosis not present

## 2024-04-23 LAB — COMPREHENSIVE METABOLIC PANEL WITH GFR
ALT: 13 U/L (ref 0–35)
AST: 18 U/L (ref 0–37)
Albumin: 4 g/dL (ref 3.5–5.2)
Alkaline Phosphatase: 71 U/L (ref 39–117)
BUN: 22 mg/dL (ref 6–23)
CO2: 24 meq/L (ref 19–32)
Calcium: 8.9 mg/dL (ref 8.4–10.5)
Chloride: 106 meq/L (ref 96–112)
Creatinine, Ser: 0.91 mg/dL (ref 0.40–1.20)
GFR: 57.5 mL/min — ABNORMAL LOW (ref >60.00)
Glucose, Bld: 90 mg/dL (ref 70–99)
Potassium: 4.3 meq/L (ref 3.5–5.1)
Sodium: 140 meq/L (ref 135–145)
Total Bilirubin: 0.6 mg/dL (ref 0.2–1.2)
Total Protein: 7.2 g/dL (ref 6.0–8.3)

## 2024-04-23 LAB — LIPID PANEL
Cholesterol: 212 mg/dL — ABNORMAL HIGH (ref 0–200)
HDL: 58.7 mg/dL (ref >39.00)
LDL Cholesterol: 119 mg/dL — ABNORMAL HIGH (ref 0–99)
NonHDL: 153.77
Total CHOL/HDL Ratio: 4
Triglycerides: 175 mg/dL — ABNORMAL HIGH (ref 0.0–149.0)
VLDL: 35 mg/dL (ref 0.0–40.0)

## 2024-04-24 ENCOUNTER — Ambulatory Visit: Payer: Self-pay | Admitting: Family Medicine

## 2024-04-25 NOTE — Telephone Encounter (Signed)
 Copied from CRM [id number]. Topic: Clinical - Lab/Test Results >> Apr 25, 2024  8:14 AM Pinkey ORN wrote: Reason for CRM: Lab Results >> Apr 25, 2024  8:15 AM Pinkey ORN wrote: Patient is requesting an office call back to go over lab results. Patient states she's unable to check via mychart. Please follow up with patient.

## 2024-04-25 NOTE — Telephone Encounter (Signed)
 LM for pt to return call.   Please provide message from provider/office when call is returned from patient.

## 2024-05-02 ENCOUNTER — Other Ambulatory Visit: Payer: Self-pay | Admitting: Family Medicine

## 2024-05-14 ENCOUNTER — Other Ambulatory Visit: Payer: Self-pay | Admitting: Family Medicine

## 2024-05-14 NOTE — Telephone Encounter (Signed)
 Last office visit 03/06/2024 for acute cough.  Last refilled 02/08/2024 for #60 with no refills. Next Appt: CPE 01/22/2025.

## 2024-05-15 MED ORDER — PREGABALIN 50 MG PO CAPS: ORAL_CAPSULE | ORAL | 0 refills | Status: DC

## 2024-07-04 ENCOUNTER — Other Ambulatory Visit: Payer: Self-pay | Admitting: Family Medicine

## 2024-07-04 NOTE — Telephone Encounter (Signed)
 Last office visit 03/06/2024 for cough.  Last refilled 05/15/2024 for #60 with no refills.  Next Appt: CPE 01/22/2025.

## 2024-07-06 ENCOUNTER — Other Ambulatory Visit: Payer: Self-pay | Admitting: Family Medicine

## 2024-12-19 ENCOUNTER — Ambulatory Visit

## 2025-01-15 ENCOUNTER — Other Ambulatory Visit

## 2025-01-22 ENCOUNTER — Encounter: Admitting: Family Medicine
